# Patient Record
Sex: Female | Born: 1935 | Race: Black or African American | Hispanic: No | Marital: Married | Smoking: Never smoker
Health system: Southern US, Community
[De-identification: ages and names within clinical notes are randomized; demographics above are authoritative.]

## PROBLEM LIST (undated history)

## (undated) DIAGNOSIS — I442 Atrioventricular block, complete: Secondary | ICD-10-CM

## (undated) DIAGNOSIS — K649 Unspecified hemorrhoids: Secondary | ICD-10-CM

## (undated) DIAGNOSIS — E119 Type 2 diabetes mellitus without complications: Secondary | ICD-10-CM

## (undated) DIAGNOSIS — K5790 Diverticulosis of intestine, part unspecified, without perforation or abscess without bleeding: Secondary | ICD-10-CM

## (undated) DIAGNOSIS — M810 Age-related osteoporosis without current pathological fracture: Secondary | ICD-10-CM

## (undated) DIAGNOSIS — Z950 Presence of cardiac pacemaker: Secondary | ICD-10-CM

## (undated) DIAGNOSIS — I10 Essential (primary) hypertension: Secondary | ICD-10-CM

## (undated) DIAGNOSIS — E785 Hyperlipidemia, unspecified: Secondary | ICD-10-CM

## (undated) DIAGNOSIS — D126 Benign neoplasm of colon, unspecified: Secondary | ICD-10-CM

## (undated) HISTORY — DX: Essential (primary) hypertension: I10

## (undated) HISTORY — DX: Diverticulosis of intestine, part unspecified, without perforation or abscess without bleeding: K57.90

## (undated) HISTORY — PX: WISDOM TOOTH EXTRACTION: SHX21

## (undated) HISTORY — DX: Presence of cardiac pacemaker: Z95.0

## (undated) HISTORY — DX: Age-related osteoporosis without current pathological fracture: M81.0

## (undated) HISTORY — DX: Type 2 diabetes mellitus without complications: E11.9

## (undated) HISTORY — DX: Atrioventricular block, complete: I44.2

## (undated) HISTORY — DX: Benign neoplasm of colon, unspecified: D12.6

## (undated) HISTORY — PX: TOTAL ABDOMINAL HYSTERECTOMY W/ BILATERAL SALPINGOOPHORECTOMY: SHX83

## (undated) HISTORY — DX: Unspecified hemorrhoids: K64.9

## (undated) HISTORY — DX: Hyperlipidemia, unspecified: E78.5

---

## 1996-11-21 HISTORY — PX: BRAIN BIOPSY: SHX905

## 1999-09-09 ENCOUNTER — Encounter: Admission: RE | Admit: 1999-09-09 | Discharge: 1999-09-09 | Payer: Self-pay | Admitting: *Deleted

## 1999-09-09 ENCOUNTER — Encounter: Payer: Self-pay | Admitting: *Deleted

## 1999-12-13 ENCOUNTER — Encounter: Payer: Self-pay | Admitting: *Deleted

## 1999-12-13 ENCOUNTER — Encounter: Admission: RE | Admit: 1999-12-13 | Discharge: 1999-12-13 | Payer: Self-pay | Admitting: *Deleted

## 2000-12-20 ENCOUNTER — Encounter: Payer: Self-pay | Admitting: Internal Medicine

## 2000-12-20 ENCOUNTER — Encounter: Admission: RE | Admit: 2000-12-20 | Discharge: 2000-12-20 | Payer: Self-pay | Admitting: Internal Medicine

## 2002-05-08 ENCOUNTER — Encounter: Payer: Self-pay | Admitting: Internal Medicine

## 2002-05-08 ENCOUNTER — Encounter: Admission: RE | Admit: 2002-05-08 | Discharge: 2002-05-08 | Payer: Self-pay | Admitting: Internal Medicine

## 2002-05-13 ENCOUNTER — Encounter: Admission: RE | Admit: 2002-05-13 | Discharge: 2002-05-13 | Payer: Self-pay | Admitting: Internal Medicine

## 2002-05-13 ENCOUNTER — Encounter: Payer: Self-pay | Admitting: Internal Medicine

## 2003-07-23 ENCOUNTER — Encounter: Admission: RE | Admit: 2003-07-23 | Discharge: 2003-07-23 | Payer: Self-pay | Admitting: Internal Medicine

## 2003-07-23 ENCOUNTER — Encounter: Payer: Self-pay | Admitting: Internal Medicine

## 2004-08-23 ENCOUNTER — Encounter: Admission: RE | Admit: 2004-08-23 | Discharge: 2004-08-23 | Payer: Self-pay | Admitting: Internal Medicine

## 2004-12-22 ENCOUNTER — Ambulatory Visit: Payer: Self-pay | Admitting: Gastroenterology

## 2005-01-03 ENCOUNTER — Ambulatory Visit: Payer: Self-pay | Admitting: Gastroenterology

## 2005-08-26 ENCOUNTER — Encounter: Admission: RE | Admit: 2005-08-26 | Discharge: 2005-08-26 | Payer: Self-pay | Admitting: Internal Medicine

## 2005-08-29 ENCOUNTER — Ambulatory Visit: Payer: Self-pay | Admitting: Internal Medicine

## 2005-09-11 ENCOUNTER — Encounter: Payer: Self-pay | Admitting: Emergency Medicine

## 2005-09-11 ENCOUNTER — Ambulatory Visit: Payer: Self-pay | Admitting: Cardiology

## 2005-09-12 ENCOUNTER — Inpatient Hospital Stay (HOSPITAL_COMMUNITY): Admission: AD | Admit: 2005-09-12 | Discharge: 2005-09-14 | Payer: Self-pay | Admitting: Cardiology

## 2005-09-12 ENCOUNTER — Encounter: Payer: Self-pay | Admitting: Internal Medicine

## 2005-09-12 HISTORY — PX: CARDIAC CATHETERIZATION: SHX172

## 2005-09-13 HISTORY — PX: PACEMAKER INSERTION: SHX728

## 2005-09-28 ENCOUNTER — Ambulatory Visit: Payer: Self-pay

## 2006-01-10 ENCOUNTER — Ambulatory Visit: Payer: Self-pay | Admitting: Internal Medicine

## 2006-08-31 ENCOUNTER — Encounter: Admission: RE | Admit: 2006-08-31 | Discharge: 2006-08-31 | Payer: Self-pay | Admitting: Internal Medicine

## 2006-09-07 ENCOUNTER — Ambulatory Visit: Payer: Self-pay | Admitting: Internal Medicine

## 2006-10-20 ENCOUNTER — Ambulatory Visit: Payer: Self-pay | Admitting: Internal Medicine

## 2007-01-12 ENCOUNTER — Ambulatory Visit: Payer: Self-pay | Admitting: Internal Medicine

## 2007-04-06 ENCOUNTER — Ambulatory Visit: Payer: Self-pay | Admitting: Internal Medicine

## 2007-04-13 ENCOUNTER — Ambulatory Visit: Payer: Self-pay | Admitting: Internal Medicine

## 2007-06-29 ENCOUNTER — Ambulatory Visit: Payer: Self-pay | Admitting: Internal Medicine

## 2007-09-04 ENCOUNTER — Encounter: Admission: RE | Admit: 2007-09-04 | Discharge: 2007-09-04 | Payer: Self-pay | Admitting: Internal Medicine

## 2007-09-21 ENCOUNTER — Ambulatory Visit: Payer: Self-pay | Admitting: Internal Medicine

## 2008-03-14 ENCOUNTER — Ambulatory Visit: Payer: Self-pay | Admitting: Internal Medicine

## 2008-04-21 ENCOUNTER — Ambulatory Visit: Payer: Self-pay | Admitting: Internal Medicine

## 2008-06-13 ENCOUNTER — Ambulatory Visit: Payer: Self-pay | Admitting: Internal Medicine

## 2008-09-08 ENCOUNTER — Encounter: Admission: RE | Admit: 2008-09-08 | Discharge: 2008-09-08 | Payer: Self-pay | Admitting: Internal Medicine

## 2008-09-12 ENCOUNTER — Ambulatory Visit: Payer: Self-pay | Admitting: Internal Medicine

## 2008-10-22 ENCOUNTER — Ambulatory Visit: Payer: Self-pay

## 2008-12-12 ENCOUNTER — Ambulatory Visit: Payer: Self-pay | Admitting: Internal Medicine

## 2009-03-09 ENCOUNTER — Encounter: Payer: Self-pay | Admitting: Internal Medicine

## 2009-03-13 ENCOUNTER — Ambulatory Visit: Payer: Self-pay | Admitting: Internal Medicine

## 2009-05-05 DIAGNOSIS — E119 Type 2 diabetes mellitus without complications: Secondary | ICD-10-CM

## 2009-05-05 DIAGNOSIS — Z950 Presence of cardiac pacemaker: Secondary | ICD-10-CM

## 2009-05-05 DIAGNOSIS — E785 Hyperlipidemia, unspecified: Secondary | ICD-10-CM | POA: Insufficient documentation

## 2009-05-05 DIAGNOSIS — I10 Essential (primary) hypertension: Secondary | ICD-10-CM

## 2009-05-05 DIAGNOSIS — I442 Atrioventricular block, complete: Secondary | ICD-10-CM

## 2009-05-18 ENCOUNTER — Ambulatory Visit: Payer: Self-pay | Admitting: Internal Medicine

## 2009-06-27 ENCOUNTER — Emergency Department (HOSPITAL_COMMUNITY): Admission: EM | Admit: 2009-06-27 | Discharge: 2009-06-27 | Payer: Self-pay | Admitting: Emergency Medicine

## 2009-09-09 ENCOUNTER — Emergency Department (HOSPITAL_COMMUNITY): Admission: EM | Admit: 2009-09-09 | Discharge: 2009-09-09 | Payer: Self-pay | Admitting: Emergency Medicine

## 2009-09-15 ENCOUNTER — Encounter: Admission: RE | Admit: 2009-09-15 | Discharge: 2009-09-15 | Payer: Self-pay | Admitting: Internal Medicine

## 2009-09-18 ENCOUNTER — Ambulatory Visit: Payer: Self-pay | Admitting: Internal Medicine

## 2009-10-01 ENCOUNTER — Encounter: Admission: RE | Admit: 2009-10-01 | Discharge: 2009-10-01 | Payer: Self-pay | Admitting: Otolaryngology

## 2009-10-29 ENCOUNTER — Ambulatory Visit: Payer: Self-pay | Admitting: Internal Medicine

## 2009-12-02 ENCOUNTER — Encounter (INDEPENDENT_AMBULATORY_CARE_PROVIDER_SITE_OTHER): Payer: Self-pay | Admitting: *Deleted

## 2009-12-17 ENCOUNTER — Emergency Department (HOSPITAL_COMMUNITY): Admission: EM | Admit: 2009-12-17 | Discharge: 2009-12-17 | Payer: Self-pay | Admitting: Emergency Medicine

## 2009-12-18 ENCOUNTER — Ambulatory Visit: Payer: Self-pay | Admitting: Internal Medicine

## 2010-03-17 ENCOUNTER — Encounter (INDEPENDENT_AMBULATORY_CARE_PROVIDER_SITE_OTHER): Payer: Self-pay | Admitting: *Deleted

## 2010-03-18 ENCOUNTER — Ambulatory Visit: Payer: Self-pay | Admitting: Gastroenterology

## 2010-03-18 ENCOUNTER — Encounter (INDEPENDENT_AMBULATORY_CARE_PROVIDER_SITE_OTHER): Payer: Self-pay | Admitting: *Deleted

## 2010-03-19 ENCOUNTER — Ambulatory Visit: Payer: Self-pay | Admitting: Internal Medicine

## 2010-03-21 DIAGNOSIS — D126 Benign neoplasm of colon, unspecified: Secondary | ICD-10-CM

## 2010-03-21 HISTORY — DX: Benign neoplasm of colon, unspecified: D12.6

## 2010-03-30 ENCOUNTER — Ambulatory Visit: Payer: Self-pay | Admitting: Gastroenterology

## 2010-03-31 ENCOUNTER — Encounter: Payer: Self-pay | Admitting: Gastroenterology

## 2010-06-01 ENCOUNTER — Ambulatory Visit: Payer: Self-pay | Admitting: Internal Medicine

## 2010-09-03 ENCOUNTER — Ambulatory Visit: Payer: Self-pay | Admitting: Internal Medicine

## 2010-09-16 ENCOUNTER — Encounter: Admission: RE | Admit: 2010-09-16 | Discharge: 2010-09-16 | Payer: Self-pay | Admitting: Internal Medicine

## 2010-12-03 ENCOUNTER — Encounter: Payer: Self-pay | Admitting: Internal Medicine

## 2010-12-12 ENCOUNTER — Encounter: Payer: Self-pay | Admitting: Internal Medicine

## 2010-12-14 ENCOUNTER — Ambulatory Visit: Payer: Self-pay | Admitting: Internal Medicine

## 2010-12-23 NOTE — Letter (Signed)
Summary: Patient Notice- Polyp Results  [facility]  [address] [place], [place] [address]   Phone: [phone]  Fax: [phone]        Mar 31, 2010 MRN: [id number]    [facility] [address] [place], [place]  [address]    Dear Ms. CEDENO,  I am pleased to inform you that the colon polyp(s) removed during your recent colonoscopy was (were) found to be benign (no cancer detected) upon pathologic examination.  I recommend you have a repeat colonoscopy examination in 3 years to look for recurrent polyps, as having colon polyps increases your risk for having recurrent polyps or even colon cancer in the future.  Should you develop new or worsening symptoms of abdominal pain, bowel habit changes or bleeding from the rectum or bowels, please schedule an evaluation with either your primary care physician or with me.  Continue treatment plan as outlined the day of your exam.  Please call us if you are having persistent problems or have questions about your condition that have not been fully answered at this time.  Sincerely,  Meryl Dare MD [facility]  This letter has been electronically signed by your physician.  Appended Document: Patient Notice- Polyp Results letter mailed

## 2010-12-23 NOTE — Cardiovascular Report (Signed)
Summary: TTM   TTM   Imported By: Roderic Ovens 04/15/2010 11:56:37  _____________________________________________________________________  External Attachment:    Type:   Image     Comment:   External Document

## 2010-12-23 NOTE — Letter (Signed)
Summary: Diabetic Instructions  [facility]  [address]   [place], [place] [address]   Phone: [phone]  Fax: [phone]    [name] [birth year] MRN: [id number]   x     ORAL DIABETIC MEDICATION INSTRUCTIONS  The day before your procedure:   Take your diabetic pill as you do normally  The day of your procedure:   Do not take your diabetic pill    We will check your blood sugar levels during the admission process and again in Recovery before discharging you home  ________________________________________________________________________

## 2010-12-23 NOTE — Cardiovascular Report (Signed)
Summary: TTM   TTM   Imported By: Roderic Ovens 09/21/2010 15:20:19  _____________________________________________________________________  External Attachment:    Type:   Image     Comment:   External Document

## 2010-12-23 NOTE — Cardiovascular Report (Signed)
Summary: Office Visit   Card Device Clinic   Imported By: Roderic Ovens 11/26/2009 14:00:36  _____________________________________________________________________  External Attachment:    Type:   Image     Comment:   External Document

## 2010-12-23 NOTE — Assessment & Plan Note (Signed)
Summary: PACER/SAF   Visit Type:  follow-up Primary Provider:  tisovec   History of Present Illness: [name] returns today for PPM followup.  She is a 76 yo woman with a h/o CHB, who has DM, and HTN.  She denies c/p, sob, or recurrent syncope.  She had no specific complaints today.  No peripheral edema.  She has remained active until the weather became hot and she now admits to being fairly sedentary.  Current Medications (verified): 1)  Janumet 50-1000 Mg Tabs (Sitagliptin-Metformin Hcl) .Marland Kitchen.. 1 By Mouth Two Times A Day 2)  Glipizide Xl 10 Mg Xr24h-Tab (Glipizide) .... Take One Tablet By Mouth Twice Daily. 3)  Simvastatin 40 Mg Tabs (Simvastatin) .... Take One Tablet By Mouth Daily At Bedtime 4)  Metoprolol Tartrate 50 Mg Tabs (Metoprolol Tartrate) .... Take One Tablet By Mouth Twice A Day 5)  Lisinopril-Hydrochlorothiazide 20-25 Mg Tabs (Lisinopril-Hydrochlorothiazide) .Marland Kitchen.. 1 By Mouth Once Daily 6)  Vivelle-Dot 0.0375 Mg/24hr Pttw (Estradiol) .... As Directed 7)  Meclizine Hcl 25 Mg Tabs (Meclizine Hcl) .... As Needed 8)  Multivitamins   Tabs (Multiple Vitamin) .... Once Daily  Allergies (verified): No Known Drug Allergies  Past History:  Past Medical History: Last updated: 05/05/2009 DYSLIPIDEMIA (ICD-272.4) DM (ICD-250.00) PACEMAKER, PERMANENT (ICD-V45.01) HYPERTENSION (ICD-401.9) ATRIOVENTRICULAR BLOCK, COMPLETE (ICD-426.0)  Past Surgical History: Last updated: 05/05/2009 Cardiac Cath 09/12/05 Dr. Charlton Haws Implantation of a Medtronic Dual chamber pacemaker 09/13/05 Dr. Sharrell Ku  Review of Systems  The patient denies chest pain, syncope, dyspnea on exertion, and peripheral edema.    Vital Signs:  Patient profile:   76 year old female Height:      60 inches Weight:      118 pounds BMI:     23.13 Pulse rate:   77 / minute BP sitting:   128 / 62  (left arm)  Vitals Entered By: Laurance Flatten CMA (June 01, 2010 3:25 PM)  Physical Exam  General:  Well  developed, well nourished, in no acute distress. Head:  normocephalic and atraumatic Eyes:  PERRLA/EOM intact; conjunctiva and lids normal. Mouth:  Teeth, gums and palate normal. Oral mucosa normal. Neck:  Neck supple, no JVD. No masses, thyromegaly or abnormal cervical nodes. Chest Wall:  Well healed PPM incision. Lungs:  Clear bilaterally to auscultation.  No wheezes, raled, or rhonchi. Heart:  RRR with normal S1 and S2.  No murmurs Abdomen:  Bowel sounds positive; abdomen soft and non-tender without masses, organomegaly, or hernias noted. No hepatosplenomegaly. Pulses:  pulses normal in all 4 extremities Extremities:  Trace peripheral edema. Neurologic:  Alert and oriented x 3.   PPM Specifications Following MD:  Lewayne Bunting, MD     PPM Vendor:  Medtronic     PPM Model Number:  [id number]     PPM Serial Number:  [id number] PPM DOI:  09/15/2005     PPM Implanting MD:  Lewayne Bunting, MD  Lead 1    Location: RA     DOI: 09/13/2005     Model #: [id number]     Serial #: [id number]     Status: active Lead 2    Location: RV     DOI: 09/13/2005     Model #: [id number]     Serial #: [id number]     Status: active   Indications:  COMPLETE HEART BLOCK   PPM Follow Up Battery Voltage:  2.79 V     Battery Est. Longevity:  56yrs     Pacer Dependent:  Yes  PPM Device Measurements Atrium  Amplitude: 2.80 mV, Impedance: 406 ohms, Threshold: 1.00 V at 0.40 msec Right Ventricle  Amplitude: 11.20 mV, Impedance: 591 ohms, Threshold: 1.00 V at 0.40 msec  Episodes MS Episodes:  0     Ventricular High Rate:  0     Atrial Pacing:  5.2%     Ventricular Pacing:  99.9%  Parameters Mode:  DDDR     Lower Rate Limit:  60     Upper Rate Limit:  120 Paced AV Delay:  150     Sensed AV Delay:  120 Next Cardiology Appt Due:  11/22/2010 Tech Comments:  NORMAL DEVICE FUNCTION.  NO EPISODES SINCE LAST CHECK.  ROV IN 6 M[facility]S CLINIC.  MEDNET CHECK 3 MTHS. Vella Kohler  June 01, 2010 3:36 PM MD Comments:  Agree with  above.  Impression & Recommendations:  Problem # 1:  PACEMAKER, PERMANENT (ICD-V45.01) Her device is working normally.  Will recheck in several months.  Problem # 2:  HYPERTENSION (ICD-401.9) Her blood pressure is well controlled today.  She will continue meds as below and maintain a low sodium diet. Her updated medication list for this problem includes:    Metoprolol Tartrate 50 Mg Tabs (Metoprolol tartrate) .Marland Kitchen... Take one tablet by mouth twice a day    Lisinopril-hydrochlorothiazide 20-25 Mg Tabs (Lisinopril-hydrochlorothiazide) .Marland Kitchen... 1 by mouth once daily  Patient Instructions: 1)  Your physician recommends that you schedule a follow-up appointment in: 12 months with Dr Ladona Ridgel

## 2010-12-23 NOTE — Miscellaneous (Signed)
Summary: [facility] previsit  Clinical Lists Changes  Medications: Added new medication of MOVIPREP 100 GM  SOLR (PEG-KCL-NACL-NASULF-NA ASC-C) As per prep instructions. - Signed Rx of MOVIPREP 100 GM  SOLR (PEG-KCL-NACL-NASULF-NA ASC-C) As per prep instructions.;  #1 x 0;  Signed;  Entered by: Karl Bales RN;  Authorized by: Meryl Dare MD [facility];  Method used: [facility] [id number]*, [address], [place], [place]  [address], Ph: [phone] or [phone], Fax: [phone] Observations: Added new observation of NKA: F (03/18/2010 11:15)    Prescriptions: MOVIPREP 100 GM  SOLR (PEG-KCL-NACL-NASULF-NA ASC-C) As per prep instructions.  #1 x 0   Entered by:   Karl Bales RN   Authorized by:   Meryl Dare MD [facility]   Signed by:   Karl Bales RN on 03/18/2010   Method used:   [facility]ronically to        [facility] [phone]* (retail)       [address]       [place], [place]  [address]       Ph: [phone] or [phone]       Fax: [phone]   RxID:   [id number]

## 2010-12-23 NOTE — Procedures (Signed)
Summary: Colonoscopy  Patient: [name] Note: All result statuses are Final unless otherwise noted.  Tests: (1) Colonoscopy (COL)   COL Colonoscopy           DONE (C)     [facility]     [address]     [place], [place]  [address]           COLONOSCOPY PROCEDURE REPORT           PATIENT:  Barbara, Valentine  MR#:  [id number]     BIRTHDATE:  [birth year], 74 yrs. old  GENDER:  female           ENDOSCOPIST:  Judie Petit T. Russella Dar, MD, [facility]           PROCEDURE DATE:  03/30/2010     PROCEDURE:  Colonoscopy with snare polypectomy, Colonoscopy with     hot biopsy     ASA CLASS:  Class II     INDICATIONS:  1) follow-up of polyp,  surveillance and high-risk     screening, adenomatous polyp, 12/2004.     MEDICATIONS:   Fentanyl 50 mcg IV, Versed 5 mg IV     DESCRIPTION OF PROCEDURE:   After the risks benefits and     alternatives of the procedure were thoroughly explained, informed     consent was obtained.  Digital rectal exam was performed and     revealed no abnormalities.   The LB [id number]-H180AL [id number] endoscope     was introduced through the anus and advanced to the cecum, which     was identified by both the appendix and ileocecal valve, without     limitations.  The quality of the prep was excellent, using     MoviPrep.  The instrument was then slowly withdrawn as the colon     was fully examined.     <<PROCEDUREIMAGES>>           FINDINGS:  A sessile polyp was found in the ascending colon. It     was 6 mm in size. Polyp was snared without cautery. Retrieval was     successful.  Two polyps were found in the mid transverse colon.     They were 5 mm in size. Polyps were snared without cautery.     Retrieval was successful. Two polyps were found in the descending     colon. They were 4 mm in size. With hot biopsy forceps the polyps     were cauterized, biopsies were obtained and sent to pathology.  A     sessile polyp was found in the sigmoid colon. It was 4 mm in size.     With hot biopsy forceps the polyp was cauterized, biopsy was     obtained and sent to pathology.  Mild diverticulosis was found in     the ascending colon. This was otherwise a normal examination of     the colon. Retroflexed views in the rectum revealed internal     hemorrhoids, small. The time to cecum =  2  minutes. The scope was     then withdrawn (time =  9  min) from the patient and the procedure     completed.           COMPLICATIONS:  None           ENDOSCOPIC IMPRESSION:     1) 6 mm sessile polyp in the ascending colon     2) 5 mm Two polyps in  the mid transverse colon     3) 4 mm Two polyps in the descending colon     4) 4 mm sessile polyp in the sigmoid colon     5) Mild diverticulosis in the ascending colon     6) Internal hemorrhoids           RECOMMENDATIONS:     1) high fiber diet     2) If the 3 or more polyps removed today are proven to be     adenomatous (pre-cancerous) polyps, you will need a colonoscopy in     3 years. Otherwise you should have a colonoscopy in 5 years.     3) No ASA/NSAIDs for 2 weeks     4) Await pathology           Grafton Warzecha T. Russella Dar, MD, [facility]           CC: Guerry Bruin, MD           n.     REVISED:  03/30/2010 10:30 AM     eSIGNED:   Judie Petit T. Sherine Cortese at 03/30/2010 10:30 AM           Benedetto Coons, [id number]  Note: An exclamation mark (!) indicates a result that was not dispersed into the flowsheet. Document Creation Date: 03/30/2010 10:31 AM _______________________________________________________________________  (1) Order result status: Final Collection or observation date-time: 03/30/2010 09:08 Requested date-time:  Receipt date-time:  Reported date-time:  Referring Physician:   Ordering Physician: Claudette Head [phone]) Specimen Source:  Source: Launa Grill Order Number: [id number] Lab site:   Appended Document: Colonoscopy     Procedures Next Due Date:    Colonoscopy: 03/2013

## 2010-12-23 NOTE — Cardiovascular Report (Signed)
Summary: Office Visit   Office Visit   Imported By: Roderic Ovens 06/04/2010 11:55:46  _____________________________________________________________________  External Attachment:    Type:   Image     Comment:   External Document

## 2010-12-23 NOTE — Letter (Signed)
Summary: Colonoscopy Letter  [facility]  [address] [place], [place] [address]   Phone: [phone]  Fax: [phone]      December 02, 2009 MRN: [id number]   [facility] [address] [place], [place]  [address]   Dear [name],   According to your medical record, it is time for you to schedule a Colonoscopy. The American Cancer Society recommends this procedure as a method to detect early colon cancer. Patients with a family history of colon cancer, or a personal history of colon polyps or inflammatory bowel disease are at increased risk.  This letter has beeen generated based on the recommendations made at the time of your procedure. If you feel that in your particular situation this may no longer apply, please contact our office.  Please call our office at [phone] to schedule this appointment or to update your records at your earliest convenience.  Thank you for cooperating with [place] to provide you with the very best care possible.   Sincerely,  Judie Petit T. Russella Dar, M.D.  [facility] Division [phone]

## 2010-12-23 NOTE — Letter (Signed)
Summary: [facility] Instructions  [facility]  [address] [place], [place] [address]   Phone: [phone]  Fax: [phone]       [name]    30-Aug-1935    MRN: [id number]        Procedure Day Dorna Bloom:  Jake Shark  03/30/10     Arrival Time:  7:30AM     Procedure Time:  8:30AM     Location of Procedure:                    _X _  [facility] (4th Floor)                        PREPARATION FOR COLONOSCOPY WITH MOVIPREP   Starting 5 days prior to your procedure 03/25/10 do not eat nuts, seeds, popcorn, corn, beans, peas,  salads, or any raw vegetables.  Do not take any fiber supplements (e.g. Metamucil, Citrucel, and Benefiber).  THE DAY BEFORE YOUR PROCEDURE         DATE: 03/29/10  DAY: MONDAY  1.  Drink clear liquids the entire day-NO SOLID FOOD  2.  Do not drink anything colored red or purple.  Avoid juices with pulp.  No orange juice.  3.  Drink at least 64 oz. (8 glasses) of fluid/clear liquids during the day to prevent dehydration and help the prep work efficiently.  CLEAR LIQUIDS INCLUDE: Water Jello Ice Popsicles Tea (sugar ok, no milk/cream) Powdered fruit flavored drinks Coffee (sugar ok, no milk/cream) Gatorade Juice: apple, white grape, white cranberry  Lemonade Clear bullion, consomm, broth Carbonated beverages (any kind) Strained chicken noodle soup Hard Candy                             4.  In the morning, mix first dose of MoviPrep solution:    Empty 1 Pouch A and 1 Pouch B into the disposable container    Add lukewarm drinking water to the top line of the container. Mix to dissolve    Refrigerate (mixed solution should be used within 24 hrs)  5.  Begin drinking the prep at 5:00 p.m. The MoviPrep container is divided by 4 marks.   Every 15 minutes drink the solution down to the next mark (approximately 8 oz) until the full liter is complete.   6.  Follow completed prep with 16 oz of clear liquid of your choice  (Nothing red or purple).  Continue to drink clear liquids until bedtime.  7.  Before going to bed, mix second dose of MoviPrep solution:    Empty 1 Pouch A and 1 Pouch B into the disposable container    Add lukewarm drinking water to the top line of the container. Mix to dissolve    Refrigerate  THE DAY OF YOUR PROCEDURE      DATE: 03/30/10  DAY: TUESDAY  Beginning at 3:30AM (5 hours before procedure):         1. Every 15 minutes, drink the solution down to the next mark (approx 8 oz) until the full liter is complete.  2. Follow completed prep with 16 oz. of clear liquid of your choice.    3. You may drink clear liquids until 6:30AM (2 HOURS BEFORE PROCEDURE).   MEDICATION INSTRUCTIONS  Unless otherwise instructed, you should take regular prescription medications with a small sip of water   as early as possible the morning  of your procedure.  Diabetic patients - see separate instructions.  Please hold your Lisinopril- HCTZ the day of your procedure only (before coming into procedure)         OTHER INSTRUCTIONS  You will need a responsible adult at least 75 years of age to accompany you and drive you home.   This person must remain in the waiting room during your procedure.  Wear loose fitting clothing that is easily removed.  Leave jewelry and other valuables at home.  However, you may wish to bring a book to read or  an iPod/MP3 player to listen to music as you wait for your procedure to start.  Remove all body piercing jewelry and leave at home.  Total time from sign-in until discharge is approximately 2-3 hours.  You should go home directly after your procedure and rest.  You can resume normal activities the  day after your procedure.  The day of your procedure you should not:   Drive   Make legal decisions   Operate machinery   Drink alcohol   Return to work  You will receive specific instructions about eating, activities and medications before you  leave.    The above instructions have been reviewed and explained to me by  Karl Bales RN  March 18, 2010 11:46 AM      I fully understand and can verbalize these instructions _____________________________ Date _________

## 2010-12-23 NOTE — Cardiovascular Report (Signed)
Summary: TTM   TTM   Imported By: Roderic Ovens 01/18/2010 13:59:40  _____________________________________________________________________  External Attachment:    Type:   Image     Comment:   External Document

## 2010-12-29 NOTE — Cardiovascular Report (Signed)
Summary: TTM   TTM   Imported By: Roderic Ovens 12/21/2010 12:02:00  _____________________________________________________________________  External Attachment:    Type:   Image     Comment:   External Document

## 2011-01-27 ENCOUNTER — Encounter (INDEPENDENT_AMBULATORY_CARE_PROVIDER_SITE_OTHER): Payer: Self-pay | Admitting: *Deleted

## 2011-02-01 NOTE — Letter (Signed)
Summary: Device-Delinquent Check  [facility], Main Office  [address]   [place], [place] [address]   Phone: [phone]  Fax: [phone]     January 27, 2011 MRN: [id number]   [facility] [address] [place], [place]  [address]   Dear Ms. WESTERFIELD,  According to our records, you have not had your implanted device checked in the recommended period of time.  We are unable to determine appropriate device function without checking your device on a regular basis.  Please call our office to schedule an appointment as soon as possible.  If you are having your device checked by another physician, please call us so that we may update our records.  Thank you,  Letta Moynahan, EMT  January 27, 2011 2:17 PM  [facility]

## 2011-02-07 LAB — GLUCOSE, CAPILLARY: Glucose-Capillary: 99 mg/dL (ref 70–99)

## 2011-02-08 LAB — GLUCOSE, CAPILLARY
Glucose-Capillary: 182 mg/dL — ABNORMAL HIGH (ref 70–99)
Glucose-Capillary: 196 mg/dL — ABNORMAL HIGH (ref 70–99)

## 2011-02-24 LAB — GLUCOSE, CAPILLARY: Glucose-Capillary: 229 mg/dL — ABNORMAL HIGH (ref 70–99)

## 2011-03-04 ENCOUNTER — Encounter: Payer: Self-pay | Admitting: Internal Medicine

## 2011-03-04 DIAGNOSIS — I442 Atrioventricular block, complete: Secondary | ICD-10-CM

## 2011-04-05 NOTE — Assessment & Plan Note (Signed)
[facility]                         ELECTROPHYSIOLOGY OFFICE NOTE   NAME:[name], [name]                   MRN:          [id number]  DATE:04/13/2007                            DOB:          1935-11-07    [name] returns today for followup.  She is a very pleasant woman  with the history of hypertension, diabetes and complete heart block, who  is status post pacemaker insertion back in 2006.  She returns today for  followup.   Her only complaint today is that she notes when she goes to certain  stores, she sets off the store alarm.  She denies any other symptoms and  has had no other specific complaints.   PHYSICAL EXAM:  She is a pleasant, well-appearing woman, in no distress.  The blood pressure 122/72, the pulse was 70 and regular, the  respirations were 18, the weight was 120 pounds.  NECK:  Revealed no jugular venous distention.  LUNGS:  Clear bilaterally to auscultation, no wheezes, rales or rhonchi.  CARDIOVASCULAR EXAM:  Revealed a regular rate and rhythm with normal S1  and S2.  EXTREMITIES:  Demonstrated no cyanosis, clubbing or edema.  The pulses  were 2+ and symmetric.   MEDICATIONS INCLUDE:  1. Metformin 1000 mg twice daily.  2. Lisinopril/HCTZ 20/25.  3. Aspirin 81 daily.  4. Simvastatin 40 daily.  5. Glyburide 5 twice a day.  6. Metoprolol 50 twice daily.   On interrogation of her pacemaker, she has a Medtronic Sigma with P-  waves greater than 2.  There were no R-waves, secondary to underlying  complete heart block with no escape.  The impedance is 424 ohms in the  atrium, 609 in the ventricle.  The threshold of 0.24 in both the right  atrium and right ventricle.  The battery voltage is 2.79 volts.   IMPRESSION:  1. Complete heart block.  2. Hypertension.  3. Status post pacemaker insertion.   DISCUSSION:  Overall, [name] is stable.  Her pacemaker is working  normally.  We will plan to see her back in the  office in one year for  pacemaker followup.     Doylene Canning. Ladona Ridgel, MD  Electronically Signed    GWT/MedQ  DD: 04/13/2007  DT: 04/13/2007  Job #: [id number]   cc:   Gaspar Garbe, M.D.

## 2011-04-05 NOTE — Assessment & Plan Note (Signed)
[facility]                         ELECTROPHYSIOLOGY OFFICE NOTE   NAME:CAMPBELLBlimy, Napoleon                   MRN:          [id number]  DATE:04/21/2008                            DOB:          [birth year]    Ms.  Lacour returns today for followup.  She is a very pleasant 76-  year-old woman with a history of diabetes, hypertension, and  dyslipidemia who has complete heart block and underwent permanent  pacemaker insertion back in October of 2006.  She returns today for  followup.  She denies chest pain.  She denies shortness of breath.  She  notes that her blood sugar has been fairly well-controlled, but when her  sugars drop below 100, she feels bad.  She has had no peripheral edema.   MEDICATIONS:  1. Janumet 50/1000 twice daily.  2. Calcium.  3. Multivitamin.  4. Metoprolol 50 a day.  5. Glyburide 5 twice a day.  6. Simvastatin 40 daily.  7. Aspirin 81 a day.  8. Lisinopril/hydrochlorothiazide 20/25 a day.   PHYSICAL EXAMINATION:  GENERAL:  She is a pleasant, very well-appearing  76 year old woman in no distress.  VITAL SIGNS:  Blood pressure was 118/78, pulse was 82 and regular,  respirations were 18.  Weight was 119 pounds.  NECK:  Revealed no jugular distention.  LUNGS:  Clear bilaterally to auscultation.  No wheezes, rales or rhonchi  were present.  CARDIAC:  Regular rate and rhythm.  Normal S1-S2.  No murmurs, rubs,  gallops.  EXTREMITIES:  No edema.   Interrogation of her pacemaker demonstrates a Medtronic Sigma.  The P  waves were greater than 2.  There were no R waves.  The impedance was  412, 620 in the V and 412 in the A.  The threshold was 1 volt at 0.4 in  the A and in the V.  The battery voltage was 2.79 volts.  She was 90% A  sense V paced 9% A paced V paced.   IMPRESSION:  1. Complete heart block.  2. Hypertension.  3. Diabetes.  4. Dyslipidemia.   DISCUSSION:  Overall, Ms. Zettler is stable.  Her pacemaker is  working  normally.  Her other problems are also stable.  We will see her back in  1 year in followup.     Doylene Canning. Ladona Ridgel, MD  Electronically Signed    GWT/MedQ  DD: 04/21/2008  DT: 04/21/2008  Job #: [id number]

## 2011-04-08 NOTE — Discharge Summary (Signed)
[name], [name]           ACCOUNT NO.:  [id number]   MEDICAL RECORD NO.:  [id number]          PATIENT TYPE:  INP   LOCATION:  4713                         FACILITY:  [facility]   PHYSICIAN:  Lorain Childes, M.D. LHCDATE OF BIRTH:  06/14/1935   DATE OF ADMISSION:  09/12/2005  DATE OF DISCHARGE:                                 DISCHARGE SUMMARY   ADDENDUM:  This concerns a corrected medication list.   1.  Lisinopril/hydrochlorothiazide 20/25 one tablet daily.  2.  Lipitor 20 mg tablets one half tablet daily at bedtime.  3.  Glyburide 5 mg tablets two in the morning and one in the evening.  4.  Enteric coated aspirin 81 mg daily.  5.  Meclizine 25 mg p.r.n.  6.  Norvasc 5 mg daily.  7.  Restart Metformin 1000 mg twice daily on Thursday, September 15, 2005.   This is a revised update for the medications for [name].  Please disregard the previous medication list on the discharge summary.      Maple Mirza, P.A.    ______________________________  Lorain Childes, M.D. [facility]    GM/MEDQ  D:  09/14/2005  T:  09/14/2005  Job:  [id number]   cc:   Gaspar Garbe, M.D.  Fax: [phone]   Doylene Canning. Ladona Ridgel, M.D.  [address]  [address]  [place]  [place] [address]

## 2011-04-08 NOTE — Cardiovascular Report (Signed)
[name], [name]           ACCOUNT NO.:  [id number]   MEDICAL RECORD NO.:  [id number]          PATIENT TYPE:  INP   LOCATION:  2913                         FACILITY:  [facility]   PHYSICIAN:  Charlton Haws, M.D.     DATE OF BIRTH:  [birth year]   DATE OF PROCEDURE:  09/12/2005  DATE OF DISCHARGE:                              CARDIAC CATHETERIZATION   Coronary arteriography.   INDICATIONS:  Complete heart block, questionable MI on ECG.   Cine catheterization was done from right femoral artery. The patient was in  complete heart block with a narrow complex QRS at a rate of 40 during the  case.   PLAN:  Because of this we did not treat her systolic hypertension  aggressively.   Left main coronary artery is normal.   Left anterior descending artery had 20-30% multi-discrete lesions  proximally. Mid and distal vessel were normal. First and second diagonal  branch were normal.   Circumflex coronary artery was nondominant and normal.   Right coronary artery is dominant and normal.   RAO VENTRICULOGRAPHY:  RAO ventriculography was normal. LV was hyperdynamic.  EF was 65%. Aortic pressures 170/58, LV pressure is 175/21.   IMPRESSION:  The patient's complete heart block would appear to be primarily  an electrical problem. She has no evidence coronary artery disease. She is  currently stable. There was no indication for temporary pacing. She will  have a permanent pacemaker placed by Dr. Lewayne Bunting in the morning.           ______________________________  Charlton Haws, M.D.     PN/MEDQ  D:  09/12/2005  T:  09/12/2005  Job:  [id number]

## 2011-04-08 NOTE — H&P (Signed)
[name], [name]           ACCOUNT NO.:  [id number]   MEDICAL RECORD NO.:  [id number]          PATIENT TYPE:  INP   LOCATION:  2913                         FACILITY:  [facility]   PHYSICIAN:  Lorain Childes, M.D. LHCDATE OF BIRTH:  1935-05-11   DATE OF ADMISSION:  09/12/2005  DATE OF DISCHARGE:                                HISTORY & PHYSICAL   PRIMARY CARE PHYSICIAN:  Gaspar Garbe, M.D.; she is new to cardiology.   CHIEF COMPLAINT:  Hypertensive urgency and complete heart block.   HISTORY OF PRESENT ILLNESS:  The patient is a 76 year old female with a  history of diabetes, hypertension, and hyperlipidemia, who presented to the  emergency room with complaints of headache and tiredness throughout the day.  The patient reports that she was seen by her primary care physician on  September 01, 2005, as she was feeling lightheaded and dizzy.  An EKG and  chest x-ray were done and reported to be okay.  Her blood pressure was noted  to be low at that visit, so her lisinopril/HCT was decreased.  She was told  to take a half a pill.  She followed her blood pressure closely following  this, and it started to elevate.  She began taking an entire pill 3-4 days  ago.  Today, she noted that she continued to feel fatigued and have some  dizziness.  She also reported a headache, which began around 3 p.m.  She  called the on call primary care physician, and was told to take an  additional dose of her high blood pressure medication, as her blood pressure  was markedly elevated, greater than 200.  Her blood pressure improved some;  however, she still felt dizzy, so she came to [facility] emergency room for  further evaluation.  She also reports that she had nausea with emesis x1 en  route.  In the emergency room, her blood pressure was noted to be 212/110.  Her heart rate was in the 30s to 40s, noted to be in complete heart block.  Cardiology was called for further evaluation.   On  further review, the patient reports she has had decreased exercise for  the past week.  She has been feeling fatigued and dizzy.  At her primary  care physician visit last week, she was given Meclizine, which has not  improved her dizziness.  She denies any chest pain, chest pressure.  No  shortness of breath, no palpitations, no syncope, no PND or orthopnea, no  lower extremity edema.   PAST MEDICAL HISTORY:  1.  Diabetes.  2.  Hypertension.  3.  Hyperlipidemia.   ALLERGIES:  SULFA.   MEDICATIONS:  1.  Metformin extended release 1 gm p.o. b.i.d.  2.  Lisinopril/HCT 20/25 mg one tablet p.o. daily (this was decreased to one-      half tablet daily by her primary care physician a week ago).  3.  Glyburide 5 mg p.o. t.i.d.  4.  Lipitor 20 mg p.o. daily.  5.  Meclizine 25 mg p.o. p.r.n. dizziness.  6.  Aspirin 81 mg p.o. daily.   SOCIAL  HISTORY:  She lives in [place] with her husband.  She is retired.  She denies any tobacco.  No alcohol, no drugs.  No herbal medications.  She  exercises on a treadmill for 20 minutes 3 times a week; however, she has not  done this for the past month.   FAMILY HISTORY:  Her mother died at the age of 28 with Alzheimer's.  Father  died in his 46s related to lung cancer.  She has 4 sisters and 3 brothers.  There is no coronary disease, no pacemakers, no sudden cardiac death.   REVIEW OF SYSTEMS:  She denies any fevers or chills.  No weight changes.  She reports headache that she had earlier today, which is improved.  She  denies any visual changes.  No hearing difficulty.  No skin rashes or  lesions.  No chest pain, shortness of breath, dyspnea on exertion,  orthopnea, PND.  No edema.  No coughing or wheezing.  No urinary symptoms.  No neuropsychiatric.  GI - she reports nausea and vomiting x1 today.  No  diarrhea, no bright red blood per rectum, no melena.  She has occasional  GERD symptoms, but none recently.  No pyuria, no polydipsia, no heat or  cold  intolerance.  All other review of systems are negative.   PHYSICAL EXAMINATION:  VITAL SIGNS:  Temperature 98.8, pulse 42,  respirations 14, blood pressure 168/78.  She is satting 100% on room air.  GENERAL:  She is a younger than stated age female, comfortable, in no acute  distress.  HEENT:  Normocephalic and atraumatic.  Oropharynx is clear.  NECK:  No bruits.  She canon A waves present.  CARDIOVASCULAR:  Normal S1, slow S2 .  Bradycardic, irregular.  No murmurs  appreciated.  Pulses are 2+ throughout.  There are no bruits.  LUNGS:  Clear to auscultation bilaterally.  ABDOMEN:  Soft, nontender.  Positive bowel sounds.  No organomegaly.  EXTREMITIES:  No edema.  NEUROLOGIC:  She is alert and oriented x3.  Cranial nerves are grossly  intact.  Strength is 5/5 in upper and lower extremities bilaterally.   EKG:  EKG shows a rate of 40, sinus rhythm, complete heart block, with  junctional escape at 40.  QRS duration is 88.  QTC is 446.  She has Q waves  noted in II, III, and aVF.  She has no ST changes, no hypertrophy.  No other  EKG is available.   LABORATORY DATA:  White count 10.8, hematocrit 36.6, platelets 277.  Potassium 3.7, creatinine 1.1, glucose 138.  CK-MB less than 1.0.  Troponin  less than 0.05.   ASSESSMENT AND PLAN:  A 76 year old female with hypertensive urgency and  complete heart block.  1.  Complete heart block.  The patient has a junctional escape in the 30s to      50s; etiology currently is unclear.  She has been symptomatic with      dizziness and fatigue.  We will assess her for ischemia by following her      cardiac enzymes.  She does have Q waves noted on her EKG.  I am      concerned about her excessive fatigue earlier in the week, maybe being      an ischemic event.  We will continue to cycle enzymes, monitor her on      telemetry.  Will decide between cardiac catheterization versus stress      test, depending on the results of her enzymes.  Will  order an     echocardiogram to assess her LV function and overall valvular function.      Will also check a thyroid panel, as this can cause complete heart block.      She is not on any medications, which would contribute to this currently.      We will maintain her on telemetry.  She has __________ on, which were      placed in the emergency room.  Will continue that for now.  Will have      electrophysiology evaluate her for probable pacemaker.  2.  Hypertensive urgency.  Her blood pressure was markedly elevated in the      emergency room and has improved currently.  We will adjust her      medications as needed.  I will continue her lisinopril and      hydrochlorothiazide, and I have also started amlodipine.  Her neurologic      exam is stable without any acute deficits.  A head CT is pending.  3.  Cardiovascular.  She does have Q waves on her EKG.  Cardiac enzymes are      being assessed, and she will undergo an ischemic evaluation during this      hospitalization.  4.  Diabetes.  I have held her metformin, as she may get contrast dye in the      setting of pacemaker implantation and possible cardiac catheterization.      I will continue her on glyburide and cover her with sliding scale.  5.  Hyperlipidemia.  Will check a fasting lipid panel.  Will continue her      current Lipitor.           ______________________________  Lorain Childes, M.D. [facility]     CGF/MEDQ  D:  09/12/2005  T:  09/12/2005  Job:  [id number]

## 2011-04-08 NOTE — Discharge Summary (Signed)
NAMEBRITANEY, ESPAILLAT           ACCOUNT NO.:  [id number]   MEDICAL RECORD NO.:  [id number]          PATIENT TYPE:  INP   LOCATION:  4713                         FACILITY:  [facility]   PHYSICIAN:  Lorain Childes, M.D. LHCDATE OF BIRTH:  [birth year]   DATE OF ADMISSION:  09/12/2005  DATE OF DISCHARGE:  09/14/2005                                 DISCHARGE SUMMARY   ALLERGIES:  SULFA which gives blisters and a rash.   DISCHARGE DOCTOR VISIT:  30 minutes.   DISCHARGE DIAGNOSES:  1.  Day #1 status post implantation of Medtronic SIGMA dual-chamber      pacemaker.  2.  Day #2 status post left heart catheterization.  This study demonstrated      ejection fraction of 65%, normal coronary anatomy except for 20-30%      lesions in the proximal left anterior descending.  3.  Admitted with fatigue/presyncope/headache, found to be in complete heart      block.   SECONDARY DIAGNOSES:  1.  Hypertension.  2.  Dyslipidemia.  3.  Diabetes.   PROCEDURE:  1.  September 12, 2005, Charlton Haws, M.D., left heart catheterization with      questionable myocardial infarction on electrocardiogram.  Left main is      normal.  Left anterior descending had 20-30% multi-discrete proximal      lesions.  Mid and distal vessel normal.  First and second diagonal      branches normal.  Left circumflex coronary artery normal and non-      dominant.  Right coronary artery dominant and normal.  Ejection fraction      65%.  2.  September 13, 2005, implantation of Medtronic SIGMA dual-chamber pacemaker      by Doylene Canning. Ladona Ridgel, M.D. for complete heart block.  The patient is      currently A sense-V pacing and is pacer dependent at the time of      discharge.   DISCHARGE DISPOSITION:  Ms. Lekas has had no post-procedural  complications from either left heart catheterization or pacemaker  implantation.  The incision at the pacemaker pocket is without hematoma,  healing nicely, nontender.  She is alert, oriented x3.   She has been  afebrile this hospitalization.  She is achieving 96% oxygen saturations on  room air.  The chest x-ray shows that the leads for the pacemaker both right  ventricular and right atrial are in appropriate position.  The device has  been interrogated the morning of post-procedure day #1 with values within  normal limits.  A Lyme titer was obtained.  This was negative this  hospitalization.  Complete blood count this hospitalization:  White cells  11.9, hemoglobin 12.8, hematocrit 38.9, platelets 274,000.  Serum  electrolytes:  Sodium 137, potassium 3.8, chloride 105, bicarbonate 26, BUN  14, creatinine 1.2, and glucose 145.   The patient discharged with instructions to maintain a low-sodium, low-  cholesterol diet.  She is asked not to drive for the next seven days, not to  lift anything more than 10 pounds for the next four weeks.  Mobility of the  left arm has  been discussed with the patient.  She is keep her incision dry  for the next seven days, to sponge bathe until Tuesday, September 20, 2005.   MEDICATIONS AT DISCHARGE:  1.  Tylenol 325 mg 1-2 tablets every 4-6 h. as needed for pain.  2.  Lisinopril/hydrochlorothiazide 20/25 1/2 tablet daily.  3.  Lipitor 20 mg daily at bedtime.  4.  Glyburide 5 mg three times daily.  5.  Enteric-coated aspirin 81 mg daily.  6.  Meclizine 25 mg daily.  7.  A new medication, Norvasc 5 mg daily.  8.  She is restart Metformin 1000 mg twice daily on Thursday, September 15, 2005.   FOLLOW UP:  [facility] at the [facility] on Wednesday, September 28, 2005, at 9:30, and she sees Dr. Ladona Ridgel on  January 10, 2006 at 11:20 in the morning.   BRIEF HISTORY:  Ms.  Dorrough is a 76 year old female.  She is presenting to  the emergency room with the complaint of headache, fatigue, and presyncope.  She presented to her primary care giver on September 01, 2005.  She felt  lightheaded and dizzy.  An EKG was  obtained and was reportedly without  abnormality.  Blood pressure was noted to be low, so her  lisinopril/hydrochlorothiazide was decreased to 1/2 a tablet daily.  Blood  pressure was followed closely after this and started to elevate.  She  started taking an entire pill 3-4 days ago.  Today, she presents, September 12, 2005, with continue fatigue and some dizziness and headache.  She  presented to the [facility] for further evaluation.  She had  nausea with emesis x1 on route.  Blood pressure was noted to be 212/110 in  the emergency room.  Heart rate was in the 30s-40s, and noted by  electrocardiogram to be in complete heart block.  Cardiology is evaluating  further.  The patient does seem to have Q waves on her electrocardiogram.  Cardiac enzymes are being assessed.  Ischemic evaluation and possible  pacemaker are planned this hospitalization.  Troponins studies were 0.09,  then 0.05.   HOSPITAL COURSE:  The patient was presented September 12, 2005 with presyncope  and fatigue, found to be in complete heart block with abnormal  electrocardiogram.  She underwent left heart catheterization on September 12, 2005.  This study showed normal coronary anatomy with ejection fraction at  65%.  Her troponin-I studies had been normal.  She then was seen by Doylene Canning.  Ladona Ridgel, M.D. in consultation for electrophysiology.  The patient does  qualify for a permanent pacemaker based on bradycardia in the setting of  complete heart block.  She underwent this procedure on September 13, 2005, and  has had no post-procedural complications.  She is A sense-V paced at the  time  of discharge.  Further laboratory studies include a fasting lipid profile.  The cholesterol is 148.  Her triglycerides are 49.  HDL cholesterol 68.  LDL  cholesterol is 70.  This is an excellent profile for a diabetic person.  TSH is 2.270.  The patient is discharged with the medication profile and  followup as  dictated.      Maple Mirza, P.A.    ______________________________  Lorain Childes, M.D. [facility]    GM/MEDQ  D:  09/14/2005  T:  09/14/2005  Job:  [id number]   cc:   Doylene Canning. Ladona Ridgel, M.D.  [address]  [address]  [place]  [place] [address]   Gaspar Garbe, M.D.  Fax: [phone]

## 2011-04-08 NOTE — Op Note (Signed)
NAMEANN, BOHNE           ACCOUNT NO.:  [id number]   MEDICAL RECORD NO.:  [id number]          PATIENT TYPE:  INP   LOCATION:  2913                         FACILITY:  [facility]   PHYSICIAN:  Doylene Canning. Ladona Ridgel, M.D.  DATE OF BIRTH:  [birth year]   DATE OF PROCEDURE:  09/13/2005  DATE OF DISCHARGE:                                 OPERATIVE REPORT   PROCEDURE PERFORMED:  Implantation of a dual-chamber pacemaker.   SURGEON:  Doylene Canning. Ladona Ridgel, M.D.   INDICATION:  Symptomatic complete heart block with a narrow ventricular  escape at 30-40 beats per minute.   I. INTRODUCTION:  The patient is a 76 year old woman with a history of  complete heart block, who presented after several weeks of feeling dizzy and  short of breath.  She was subsequently found to have sinus rhythm with  complete heart block and a ventricular escape rate of 40 beats per minute.  She is on no A-V nodal blocking agents.  Because of multiple cardiac risk  factors as well as inferior Q waves in the setting of heart block, she  underwent catheterization  demonstrating nonobstructive coronary disease and  preserved LV function.  She now has presents for pacemaker implantation.   II. PROCEDURE:  After informed consent was obtained, the patient was taken  to the diagnostic EP lab in a fasting state.  After the usual preparation  and draping, intravenous fentanyl and midazolam were given for sedation.  Thirty milliliters of lidocaine were infiltrated into the left  infraclavicular region.  A 5-cm incision was carried out over this region  and electrocautery utilized to dissect down to the fascial plane.  Ten  milliliters of contrast were injected into the left upper extremity venous  system, demonstrating a patent left subclavian vein.  It was punctured x2  and the Medtronic model 5076 52-cm active-fixation pacing lead, serial  number [id number], was advanced to the right ventricle and the Medtronic  model 5076 45-cm  active-fixation pacing lead, serial number [id number], was  advanced to the right atrium.  Mapping was carried out in the right  ventricle.  Care was taken to place the ventricular pacing lead on the RV  septum, where R waves measured 11 mV and the pacing impedance was 784 ohms  with lead actively affixed.  Ten-volt pacing did not stimulate the  diaphragm.  The pacing threshold was 0.5 volts at 0.5 milliseconds.  With  the ventricular lead in satisfactory position, attention was then turned to  placement of the atrial lead.  It was placed in the right atrial appendage,  where P waves measured 2 mV and the pacing impedance was 627 ohms.  The  pacing threshold was 1 volt at 0.5 in the atrium.  Ten-volt pacing did not  stimulate the diaphragm.  With both atrial and ventricular leads in  satisfactory position, they were secured to the subpectoralis fascia with a  figure-of-eight silk suture.  The sew-in sleeve also utilized a silk suture.  Electrocautery was then utilized to make a subcutaneous pocket.  Kanamycin  irrigation was utilized to irrigate the pocket and electrocautery utilized  to  assure hemostasis.  The Medtronic Sigma dual-chamber pacemaker, serial  number [id number], was connected to the atrial and ventricular pacing leads  and placed in the subcutaneous pocket.  The generator was secured with silk  suture.  Additional kanamycin was utilized to irrigate the pocket and the  incision then closed with layer of 2-0 Vicryl followed by layer 3-0 Vicryl,  followed by a layer of 4-0 Vicryl.  Benzoin was painted on the skin, Steri-  Strips were applied and a pressure dressing was placed and the patient was  returned to her room in satisfactory condition.   III. COMPLICATIONS:  There were no immediate procedure complications.   IV. RESULTS:  This demonstrates successful implantation of a Medtronic dual-  chamber pacemaker in a patient with symptomatic complete heart block.            ______________________________  Doylene Canning. Ladona Ridgel, M.D.     GWT/MEDQ  D:  09/13/2005  T:  09/13/2005  Job:  [id number]   cc:   Gaspar Garbe, M.D.  Fax: [phone]

## 2011-04-08 NOTE — Assessment & Plan Note (Signed)
[facility]                           ELECTROPHYSIOLOGY OFFICE NOTE   NAME:CAMPBELLLaural Golden                    MRN:          [id number]  DATE:09/07/2006                            DOB:          03-01-1935    PACEMAKER NOTE:  Ms. Puett was seen today in the clinic on September 07, 2006 for followup of her Medtronic model #303B Sigma.   Date of implant is September 13, 2005 for complete heart block.   On interrogation of her device today, her battery voltage was 2.79, P waves  measured greater than 2.8 millivolts with an atrial capture threshold of 0.5  volts at 0.4 msec and an atrial lead impedance of 425. R waves are not  measured. She is pacemaker dependent to a rate of 30 with a ventricular  pacing threshold of 1 volt at 0.4 msec and a ventricular lead impedance of  622. No changes were made in her parameters today. She will start telephone  checks on an every 3 month basis through [employer] with a return office visit  in 1 year's time.      ______________________________  Altha Harm, LPN    ______________________________  Doylene Canning. Ladona Ridgel, MD    PO/MedQ  DD:  09/07/2006  DT:  09/09/2006  Job #:  [id number]

## 2011-04-08 NOTE — Consult Note (Signed)
[name], Valentine           ACCOUNT NO.:  [id number]   MEDICAL RECORD NO.:  [id number]          PATIENT TYPE:  INP   LOCATION:  2913                         FACILITY:  [facility]   PHYSICIAN:  Doylene Canning. Ladona Ridgel, M.D.  DATE OF BIRTH:  20-Apr-1935   DATE OF CONSULTATION:  09/12/2005  DATE OF DISCHARGE:                                   CONSULTATION   REQUESTING PHYSICIAN:  Learta Codding, M.D.   INDICATION FOR CONSULTATION:  Evaluation of complete heart block.   HISTORY OF PRESENT ILLNESS:  The patient is a 76 year old woman who was  admitted to the hospital for complaints of dizziness, lightheadedness and  shortness of breath.  Initial EKG demonstrated sinus rhythm with a complete  heart block and a narrow QRS escape, and she is admitted for additional  evaluation.  She denies any syncope.  She denies chest pain.  She denies  peripheral edema.  She does note that for the last one week she has had  increasing fatigue and weakness and decrease in her energy level.  She  denies PND or orthopnea.  She has been quite hypertensive.  In the emergency  room she was found to have a systolic blood pressure of over 200.  At the  same time, her heart rate was 30 to 35 beats per minute.  She was admitted  for additional evaluation.  As noted, she denies syncope.   ALLERGIES:  SHE GIVES A HISTORY OF ALLERGY TO SULFA DRUGS.   PAST MEDICAL HISTORY:  Notable for diabetes, hypertension, and dyslipidemia.   MEDICATIONS:  Include metformin, lisinopril/HCTZ, glyburide, Lipitor and  aspirin.   SOCIAL HISTORY:  The patient lives in [place] with her husband.  She is  married.  She denies tobacco abuse.  She denies alcohol use.   FAMILY HISTORY:  Notable for mother dying age 69 of complications of  Alzheimer's disease and father dying of lung cancer in his 5s.  She has  multiple siblings with no premature atherosclerosis.   REVIEW OF SYSTEMS:  Notable for occasional chills and fever.  She denies  any  night sweats.  She denies any weight changes.  She denies headache, vision  or hearing problems.  She denies skin rashes or problems.  She denies chest  pain, shortness of breath, dyspnea except as previously noted.  She denies  PND or orthopnea.  She has had no syncope.  She denies claudication.  She  denies urinary frequency or dysuria or hematuria. She denies nocturia.  She  denies weakness, numbness or depression.  She denies arthralgias or  arthritis.  She has occasional nausea but no vomiting or diarrhea.  She  denies melena or hematemesis.  She does have reflux symptoms.  She denies  polyuria, polydipsia, heat or cold intolerance.  The rest of her review of  systems was negative.   PHYSICAL EXAMINATION:  She is a pleasant, 76 year old woman in no distress.  The blood pressure was 170/78.  Pulse was 40 and regular.  Respirations were  16.  Temperature was 98.8.  HEENT exam is normocephalic, atraumatic.  Pupils  equal and round.  Oropharynx moist.  Sclerae were anicteric.  The neck  revealed no jugular venous distention.  There was no thyromegaly.  The  trachea was midline.  There were intermittent Lady Gary a waves present.  The  abdominal exam was soft, nontender, nondistended.  There was no  organomegaly.  The bowel sounds were present, and there was no rebound or  guarding present.  The lungs were clear bilaterally to auscultation.  There  were no wheezes, rales or rhonchi.  There was no increased work of  breathing.  The cardiovascular exam revealed a regular bradycardia with  normal S1 and S2.  PMI was not laterally displaced nor enlarged.  Extremities demonstrated no cyanosis, clubbing or edema.  The pulses were 2+  and symmetric.  The skin exam was normal.  The joint exam was normal.  Neurological exam revealed alert and oriented times three with cranial  nerves intact.  Strength was 5/5 and symmetric.  The patient denied  depression.   On EKG, she has sinus rhythm/sinus  tachycardia with complete heart block and  a narrow QRS escape at approximately 40 beats per minute.  There were  inferior Q waves which were nondiagnostic.  Other laboratory findings  revealed a creatinine of 1.1 and hemoglobin of 10.5.   IMPRESSION:  1.  Complete heart block with a junctional escape varying between 30 and 40      beats per minute which is narrow.  2.  Hypertension.  3.  Dyslipidemia.  4.  Diabetes.  5.  Small Q waves on the EKG of unknown clinical significance.   DISCUSSION:  Will plan to proceed with left heart catheterization secondary  to the concern about underlying coronary artery disease causing her complete  heart block.  Her troponin is mildly elevated at 0.09.  If there is no  obstructive coronary disease, then we will plan to proceed with permanent  pacemaker implantation.           ______________________________  Doylene Canning. Ladona Ridgel, M.D.     GWT/MEDQ  D:  09/12/2005  T:  09/12/2005  Job:  [id number]   cc:   Gaspar Garbe, M.D.  Fax: [phone]

## 2011-06-10 ENCOUNTER — Encounter: Payer: Self-pay | Admitting: Internal Medicine

## 2011-06-14 ENCOUNTER — Encounter: Payer: Self-pay | Admitting: Internal Medicine

## 2011-06-14 ENCOUNTER — Ambulatory Visit (INDEPENDENT_AMBULATORY_CARE_PROVIDER_SITE_OTHER): Payer: Medicare Other | Admitting: Internal Medicine

## 2011-06-14 DIAGNOSIS — I442 Atrioventricular block, complete: Secondary | ICD-10-CM

## 2011-06-14 DIAGNOSIS — Z950 Presence of cardiac pacemaker: Secondary | ICD-10-CM

## 2011-06-14 DIAGNOSIS — I10 Essential (primary) hypertension: Secondary | ICD-10-CM

## 2011-06-14 NOTE — Progress Notes (Signed)
HPI [name] returns today for followup. She has a long history of hypertension and symptomatic bradycardia and is status post permanent pacemaker insertion. She has underlying complete heart block. The patient denies syncope. Her blood pressure at home as been fairly well controlled typically with systolic pressures in the 120-130 range. She denies chest pain, shortness of breath, or peripheral edema. Allergies  Allergen Reactions  . Sulfa Drugs Cross Reactors      Current Outpatient Prescriptions  Medication Sig Dispense Refill  . aspirin 81 MG tablet Take 81 mg by mouth daily.        Marland Kitchen estradiol (VIVELLE-DOT) 0.0375 MG/24HR Place 1 patch onto the skin as directed.        Marland Kitchen glipiZIDE (GLUCOTROL XL) 10 MG 24 hr tablet Take 10 mg by mouth 2 (two) times daily.        Marland Kitchen lisinopril-hydrochlorothiazide (PRINZIDE,ZESTORETIC) 20-25 MG per tablet Take 1 tablet by mouth daily.        . meclizine (ANTIVERT) 25 MG tablet Take 25 mg by mouth as needed.        . metoprolol (LOPRESSOR) 50 MG tablet Take 50 mg by mouth 2 (two) times daily.        . Multiple Vitamin (MULTIVITAMIN) tablet Take 1 tablet by mouth daily.        . simvastatin (ZOCOR) 40 MG tablet Take 40 mg by mouth at bedtime.        . sitaGLIPtan-metformin (JANUMET) 50-1000 MG per tablet Take 1 tablet by mouth 2 (two) times daily.           Past Medical History  Diagnosis Date  . Dyslipidemia   . DM (diabetes mellitus)   . Pacemaker   . HTN (hypertension)   . Atrioventricular block, complete     ROS:   All systems reviewed and negative except as noted in the HPI.   Past Surgical History  Procedure Date  . Cardiac catheterization 09/12/05    Dr. Charlton Haws   . Pacemaker insertion 09/13/05    Medtronic, dual chamber. Dr. Ladona Ridgel      No family history on file.   History   Social History  . Marital Status: Married    Spouse Name: N/A    Number of Children: N/A  . Years of Education: N/A   Occupational History   . Not on file.   Social History Main Topics  . Smoking status: Never Smoker   . Smokeless tobacco: Not on file   Comment: tobacco use - no  . Alcohol Use: No  . Drug Use: Not on file  . Sexually Active: Not on file   Other Topics Concern  . Not on file   Social History Narrative   Lives in [place] with her husband.      BP 148/80  Pulse 80  Resp 14  Ht 5\' 1"  (1.549 m)  Wt 118 lb (53.524 kg)  BMI 22.30 kg/m2  Physical Exam:  Well appearing NAD HEENT: Unremarkable Neck:  No JVD, no thyromegally Lymphatics:  No adenopathy Back:  No CVA tenderness Lungs:  Clear; well-healed pacemaker incision. HEART:  Regular rate rhythm, no murmurs, no rubs, no clicks Abd:  soft, positive bowel sounds, no organomegally, no rebound, no guarding Ext:  2 plus pulses, no edema, no cyanosis, no clubbing Skin:  No rashes no nodules Neuro:  CN II through XII intact, motor grossly intact  DEVICE  Normal device function.  See PaceArt for details.   Assess/Plan:

## 2011-06-14 NOTE — Assessment & Plan Note (Signed)
Her blood pressure is slightly elevated today. At home however she notes that her pressures are well controlled. For this reason I will not increase her blood pressure medications at this time.

## 2011-06-14 NOTE — Assessment & Plan Note (Signed)
Her device is working normally. Will recheck in several months. 

## 2011-08-22 ENCOUNTER — Other Ambulatory Visit: Payer: Self-pay | Admitting: Internal Medicine

## 2011-08-22 DIAGNOSIS — Z1231 Encounter for screening mammogram for malignant neoplasm of breast: Secondary | ICD-10-CM

## 2011-09-16 ENCOUNTER — Encounter: Payer: Self-pay | Admitting: Internal Medicine

## 2011-09-16 DIAGNOSIS — I442 Atrioventricular block, complete: Secondary | ICD-10-CM

## 2011-09-19 ENCOUNTER — Ambulatory Visit
Admission: RE | Admit: 2011-09-19 | Discharge: 2011-09-19 | Disposition: A | Discharge status: Home / Self Care | Payer: Medicare Other | Source: Ambulatory Visit | Attending: Internal Medicine | Admitting: Internal Medicine

## 2011-09-19 DIAGNOSIS — Z1231 Encounter for screening mammogram for malignant neoplasm of breast: Secondary | ICD-10-CM

## 2011-11-22 HISTORY — PX: COLONOSCOPY: SHX174

## 2011-12-02 ENCOUNTER — Encounter: Payer: Self-pay | Admitting: Internal Medicine

## 2011-12-02 DIAGNOSIS — I442 Atrioventricular block, complete: Secondary | ICD-10-CM

## 2012-03-02 ENCOUNTER — Encounter: Payer: Self-pay | Admitting: Internal Medicine

## 2012-03-02 DIAGNOSIS — I442 Atrioventricular block, complete: Secondary | ICD-10-CM

## 2012-05-30 ENCOUNTER — Ambulatory Visit (INDEPENDENT_AMBULATORY_CARE_PROVIDER_SITE_OTHER): Payer: Medicare Other | Admitting: *Deleted

## 2012-05-30 ENCOUNTER — Encounter: Payer: Self-pay | Admitting: Internal Medicine

## 2012-05-30 DIAGNOSIS — I442 Atrioventricular block, complete: Secondary | ICD-10-CM

## 2012-05-30 NOTE — Progress Notes (Signed)
PPM check 

## 2012-06-01 LAB — PACEMAKER DEVICE OBSERVATION
AL AMPLITUDE: 2.8 mv
AL IMPEDENCE PM: 419 Ohm
AL THRESHOLD: 1 V
ATRIAL PACING PM: 4.8
BAMS-0001: 150 {beats}/min
BATTERY VOLTAGE: 2.78 V
RV LEAD IMPEDENCE PM: 545 Ohm
RV LEAD THRESHOLD: 1 V
VENTRICULAR PACING PM: 100

## 2012-06-27 ENCOUNTER — Encounter: Payer: Self-pay | Admitting: Gastroenterology

## 2012-07-31 ENCOUNTER — Encounter: Payer: Self-pay | Admitting: Gastroenterology

## 2012-07-31 ENCOUNTER — Other Ambulatory Visit (INDEPENDENT_AMBULATORY_CARE_PROVIDER_SITE_OTHER): Payer: Medicare Other

## 2012-07-31 ENCOUNTER — Ambulatory Visit (INDEPENDENT_AMBULATORY_CARE_PROVIDER_SITE_OTHER): Payer: Medicare Other | Admitting: Gastroenterology

## 2012-07-31 VITALS — BP 110/70 | HR 72 | Ht 59.25 in | Wt 125.0 lb

## 2012-07-31 DIAGNOSIS — D649 Anemia, unspecified: Secondary | ICD-10-CM

## 2012-07-31 DIAGNOSIS — K219 Gastro-esophageal reflux disease without esophagitis: Secondary | ICD-10-CM

## 2012-07-31 DIAGNOSIS — R195 Other fecal abnormalities: Secondary | ICD-10-CM

## 2012-07-31 DIAGNOSIS — Z79899 Other long term (current) drug therapy: Secondary | ICD-10-CM

## 2012-07-31 LAB — IBC PANEL
Iron: 63 ug/dL (ref 42–145)
Saturation Ratios: 18.1 % — ABNORMAL LOW (ref 20.0–50.0)
Transferrin: 248.1 mg/dL (ref 212.0–360.0)

## 2012-07-31 LAB — FOLATE: Folate: 18.9 ng/mL (ref >5.9)

## 2012-07-31 MED ORDER — MOVIPREP 100 G PO SOLR: 1.0000 | Freq: Once | ORAL | Status: DC

## 2012-07-31 MED ORDER — OMEPRAZOLE 20 MG PO CPDR: 20.0000 mg | DELAYED_RELEASE_CAPSULE | Freq: Every day | ORAL | Status: DC

## 2012-07-31 NOTE — Progress Notes (Addendum)
History of Present Illness: This is a 77 year old female who underwent colonoscopy in may 2011 showing 4 adenomatous colon polyps, descending colon diverticulosis and internal hemorrhoids. She is have a mild normocytic anemia with a hemoglobin of 11.3 and an MCV of 95.2. She notes frequent belching and heartburn symptoms occurring about twice each week. The symptoms have increased in frequency over the past several months. Stool Hemosure testing at Dr. Deneen Harts office was positive. Denies weight loss, abdominal pain, constipation, diarrhea, change in stool caliber, melena, hematochezia, nausea, vomiting, dysphagia, chest pain.   Current Medications, Allergies, Past Medical History, Past Surgical History, Family History and Social History were reviewed in [employer] record.  Physical Exam: General: Well developed , well nourished, no acute distress Head: Normocephalic and atraumatic Eyes:  sclerae anicteric, EOMI Ears: Normal auditory acuity Mouth: No deformity or lesions Lungs: Clear throughout to auscultation Heart: Regular rate and rhythm; no murmurs, rubs or bruits Abdomen: Soft, non tender and non distended. No masses, hepatosplenomegaly or hernias noted. Normal Bowel sounds Rectal: No external lesions her anal canal was stenotic but nontender, Hemoccult negative soft brown stool in the vault Musculoskeletal: Symmetrical with no gross deformities  Pulses:  Normal pulses noted Extremities: No clubbing, cyanosis, edema or deformities noted Neurological: Alert oriented x 4, grossly nonfocal Psychological:  Alert and cooperative. Normal mood and affect  Assessment and Recommendations:  1. Hemosure positive stool, likely from internal hemorrhoids. Anal stenosis. Given her history of multiple adenomatous colon polyps it is reasonable to exclude recurrent neoplasm. Schedule colonoscopy. The risks, benefits, and alternatives to colonoscopy with possible biopsy and possible  polypectomy were discussed with the patient and they consent to proceed.   2. Normocytic anemia. Standard serologic evaluation.  3. Personal history of adenomatous colon polyps. See #1.

## 2012-07-31 NOTE — Patient Instructions (Addendum)
Your physician has requested that you go to the basement for the following lab work before leaving today: Anemia profile.   You have been scheduled for a colonoscopy with propofol. Please follow written instructions given to you at your visit today.  Please pick up your prep kit at the pharmacy within the next 1-3 days. If you use inhalers (even only as needed), please bring them with you on the day of your procedure.  We have sent the following medications to your pharmacy for you to pick up at your convenience: Omeprazole.  Patient advised to avoid spicy, acidic, citrus, chocolate, mints, fruit and fruit juices.  Limit the intake of caffeine, alcohol and Soda.  Don't exercise too soon after eating.  Don't lie down within 3-4 hours of eating.  Elevate the head of your bed.  cc: Guerry Bruin, MD

## 2012-08-01 ENCOUNTER — Other Ambulatory Visit: Payer: Self-pay

## 2012-08-01 MED ORDER — FERROUS SULFATE 325 (65 FE) MG PO TABS: 325.0000 mg | ORAL_TABLET | Freq: Two times a day (BID) | ORAL | Status: DC

## 2012-08-22 ENCOUNTER — Other Ambulatory Visit: Payer: Self-pay | Admitting: Internal Medicine

## 2012-08-22 DIAGNOSIS — Z1231 Encounter for screening mammogram for malignant neoplasm of breast: Secondary | ICD-10-CM

## 2012-08-29 ENCOUNTER — Ambulatory Visit (AMBULATORY_SURGERY_CENTER): Payer: Medicare Other | Admitting: Gastroenterology

## 2012-08-29 ENCOUNTER — Encounter: Payer: Self-pay | Admitting: Gastroenterology

## 2012-08-29 VITALS — BP 133/61 | HR 60 | Temp 97.1°F | Resp 10 | Ht 59.0 in | Wt 125.0 lb

## 2012-08-29 DIAGNOSIS — Z86010 Personal history of colonic polyps: Secondary | ICD-10-CM

## 2012-08-29 DIAGNOSIS — D509 Iron deficiency anemia, unspecified: Secondary | ICD-10-CM

## 2012-08-29 DIAGNOSIS — R195 Other fecal abnormalities: Secondary | ICD-10-CM

## 2012-08-29 DIAGNOSIS — D126 Benign neoplasm of colon, unspecified: Secondary | ICD-10-CM

## 2012-08-29 MED ORDER — SODIUM CHLORIDE 0.9 % IV SOLN: 500.0000 mL | INTRAVENOUS | Status: DC

## 2012-08-29 NOTE — Op Note (Signed)
[facility] [address] [place], [address]   COLONOSCOPY PROCEDURE REPORT  PATIENT: [name], [name]  MR#: [id number] BIRTHDATE: March 07, 1935 , 77  yrs. old GENDER: Female ENDOSCOPIST: Meryl Dare, MD, [facility] PROCEDURE DATE:  08/29/2012 PROCEDURE:   Colonoscopy with snare polypectomy ASA CLASS:   Class II INDICATIONS:heme-positive stool, iron deficiency anemia, and patient's personal history of adenomatous colon polyps. MEDICATIONS: MAC sedation, administered by CRNA and propofol (Diprivan) 100mg  IV DESCRIPTION OF PROCEDURE:  After the risks benefits and alternatives of the procedure were thoroughly explained, informed consent was obtained.  A digital rectal exam revealed anal stenosis.  The LB [id number]-[id number]  endoscope was introduced through the anus and advanced to the cecum, which was identified by both the appendix and ileocecal valve. No adverse events experienced.  The quality of the prep was good, using MoviPrep. The instrument was then slowly withdrawn as the colon was fully examined.   COLON FINDINGS: Mild diverticulosis was noted in the ascending colon.   A sessile polyp measuring 1 cm in size was found in the descending colon.  A polypectomy was performed using snare cautery. The resection was complete and the polyp tissue was completely retrieved.   The colon was otherwise normal.  There was no diverticulosis, inflammation, polyps or cancers unless previously stated.  Retroflexed views revealed moderate internal hemorrhoids. The time to cecum=1 minutes 29 seconds.  Withdrawal time=8 minutes 35 seconds.  The scope was withdrawn and the procedure completed. COMPLICATIONS: There were no complications.  ENDOSCOPIC IMPRESSION: 1.   Mild diverticulosis was noted in the ascending colon 2.   Sessile polyp in the descending colon; polypectomy was performed using snare cautery 3.   Moderate internal hemorrhoids  RECOMMENDATIONS: 1.  Hold  aspirin, aspirin products, and anti-inflammatory medication for 2 weeks. 2.  Await pathology results 3.  Repeat Colonoscopy in 3 years 4.  If fe deficiency does not correct or returns would proceed with EGD  eSigned:  Meryl Dare, MD, [facility] 08/29/2012 11:39 AM   cc: Guerry Bruin, MD

## 2012-08-29 NOTE — Patient Instructions (Signed)
HOLD ASPIRIN PRODUCTS, IBUPROFEN AND NSAIDS FOR TWO WEEKS, October 23,2013    YOU HAD AN ENDOSCOPIC PROCEDURE TODAY AT THE [facility]: Refer to the procedure report that was given to you for any specific questions about what was found during the examination.  If the procedure report does not answer your questions, please call your gastroenterologist to clarify.  If you requested that your care partner not be given the details of your procedure findings, then the procedure report has been included in a sealed envelope for you to review at your convenience later.  YOU SHOULD EXPECT: Some feelings of bloating in the abdomen. Passage of more gas than usual.  Walking can help get rid of the air that was put into your GI tract during the procedure and reduce the bloating. If you had a lower endoscopy (such as a colonoscopy or flexible sigmoidoscopy) you may notice spotting of blood in your stool or on the toilet paper. If you underwent a bowel prep for your procedure, then you may not have a normal bowel movement for a few days.  DIET: Your first meal following the procedure should be a light meal and then it is ok to progress to your normal diet.  A half-sandwich or bowl of soup is an example of a good first meal.  Heavy or fried foods are harder to digest and may make you feel nauseous or bloated.  Likewise meals heavy in dairy and vegetables can cause extra gas to form and this can also increase the bloating.  Drink plenty of fluids but you should avoid alcoholic beverages for 24 hours.  ACTIVITY: Your care partner should take you home directly after the procedure.  You should plan to take it easy, moving slowly for the rest of the day.  You can resume normal activity the day after the procedure however you should NOT DRIVE or use heavy machinery for 24 hours (because of the sedation medicines used during the test).    SYMPTOMS TO REPORT IMMEDIATELY: A gastroenterologist can be reached at any  hour.  During normal business hours, 8:30 AM to 5:00 PM Monday through Friday, call [phone].  After hours and on weekends, please call the GI answering service at [phone] who will take a message and have the physician on call contact you.   Following lower endoscopy (colonoscopy or flexible sigmoidoscopy):  Excessive amounts of blood in the stool  Significant tenderness or worsening of abdominal pains  Swelling of the abdomen that is new, acute  Fever of 100F or higher  FOLLOW UP: If any biopsies were taken you will be contacted by phone or by letter within the next 1-3 weeks.  Call your gastroenterologist if you have not heard about the biopsies in 3 weeks.  Our staff will call the home number listed on your records the next business day following your procedure to check on you and address any questions or concerns that you may have at that time regarding the information given to you following your procedure. This is a courtesy call and so if there is no answer at the home number and we have not heard from you through the emergency physician on call, we will assume that you have returned to your regular daily activities without incident.  SIGNATURES/CONFIDENTIALITY: You and/or your care partner have signed paperwork which will be entered into your electronic medical record.  These signatures attest to the fact that that the information above on your After Visit Summary  has been reviewed and is understood.  Full responsibility of the confidentiality of this discharge information lies with you and/or your care-partner.  

## 2012-08-29 NOTE — Progress Notes (Signed)
Patient did not experience any of the following events: a burn prior to discharge; a fall within the facility; wrong site/side/patient/procedure/implant event; or a hospital transfer or hospital admission upon discharge from the facility. (G8907) Patient did not have preoperative order for IV antibiotic SSI prophylaxis. (G8918)  

## 2012-08-30 ENCOUNTER — Telehealth: Payer: Self-pay | Admitting: *Deleted

## 2012-08-30 NOTE — Telephone Encounter (Signed)
  Follow up Call-  Call back number 08/29/2012  Post procedure Call Back phone  # [phone]  Permission to leave phone message Yes     Patient questions:  Do you have a fever, pain , or abdominal swelling? no Pain Score  0 *  Have you tolerated food without any problems? yes  Have you been able to return to your normal activities? yes  Do you have any questions about your discharge instructions: Diet   no Medications  no Follow up visit  no  Do you have questions or concerns about your Care? no  Actions: * If pain score is 4 or above: No action needed, pain <4.

## 2012-08-31 DIAGNOSIS — I442 Atrioventricular block, complete: Secondary | ICD-10-CM

## 2012-09-04 ENCOUNTER — Encounter: Payer: Self-pay | Admitting: Gastroenterology

## 2012-09-19 ENCOUNTER — Ambulatory Visit
Admission: RE | Admit: 2012-09-19 | Discharge: 2012-09-19 | Disposition: A | Discharge status: Home / Self Care | Payer: Medicare Other | Source: Ambulatory Visit | Attending: Internal Medicine | Admitting: Internal Medicine

## 2012-09-19 DIAGNOSIS — Z1231 Encounter for screening mammogram for malignant neoplasm of breast: Secondary | ICD-10-CM

## 2012-11-27 ENCOUNTER — Encounter: Payer: Self-pay | Admitting: Internal Medicine

## 2012-11-27 ENCOUNTER — Ambulatory Visit (INDEPENDENT_AMBULATORY_CARE_PROVIDER_SITE_OTHER): Payer: Medicare Other | Admitting: Internal Medicine

## 2012-11-27 VITALS — BP 145/75 | HR 66 | Ht 60.0 in | Wt 127.0 lb

## 2012-11-27 DIAGNOSIS — I442 Atrioventricular block, complete: Secondary | ICD-10-CM

## 2012-11-27 DIAGNOSIS — Z950 Presence of cardiac pacemaker: Secondary | ICD-10-CM

## 2012-11-27 DIAGNOSIS — I10 Essential (primary) hypertension: Secondary | ICD-10-CM

## 2012-11-27 LAB — PACEMAKER DEVICE OBSERVATION
ATRIAL PACING PM: 3.4
BAMS-0001: 150 {beats}/min
RV LEAD IMPEDENCE PM: 593 Ohm
RV LEAD THRESHOLD: 1 V

## 2012-11-27 NOTE — Progress Notes (Signed)
HPI [name] returns today for followup. She is a 78 year old woman with a history of bleeding heart block, hypertension, and dyslipidemia, status post permanent pacemaker insertion. In the interim, she has been stable. She denies chest pain or shortness of breath. No syncope and no peripheral edema. Allergies  Allergen Reactions  . Sulfa Drugs Cross Reactors      Current Outpatient Prescriptions  Medication Sig Dispense Refill  . aspirin 81 MG tablet Take 81 mg by mouth daily.        Marland Kitchen estradiol (VIVELLE-DOT) 0.0375 MG/24HR Place 1 patch onto the skin as directed.        . ferrous sulfate 325 (65 FE) MG tablet Take 1 tablet (325 mg total) by mouth 2 (two) times daily.  60 tablet  0  . glipiZIDE (GLUCOTROL XL) 10 MG 24 hr tablet Take 10 mg by mouth 2 (two) times daily.        Marland Kitchen lisinopril-hydrochlorothiazide (PRINZIDE,ZESTORETIC) 20-25 MG per tablet Take 1 tablet by mouth daily.        . meclizine (ANTIVERT) 25 MG tablet Take 25 mg by mouth as needed.        . metoprolol (LOPRESSOR) 50 MG tablet Take 50 mg by mouth 2 (two) times daily.        . Multiple Vitamin (MULTIVITAMIN) tablet Take 1 tablet by mouth daily.        Marland Kitchen omeprazole (PRILOSEC) 20 MG capsule Take 1 capsule (20 mg total) by mouth daily.  30 capsule  11  . pioglitazone (ACTOS) 15 MG tablet Take 15 mg by mouth daily.      . simvastatin (ZOCOR) 40 MG tablet Take 40 mg by mouth at bedtime.        . sitaGLIPtan-metformin (JANUMET) 50-1000 MG per tablet Take 1 tablet by mouth 2 (two) times daily.          Past Medical History  Diagnosis Date  . DM (diabetes mellitus)   . Pacemaker   . HTN (hypertension)   . Atrioventricular block, complete   . Tubular adenoma of colon 03/2010  . Diverticulosis   . Hemorrhoids   . Hyperlipidemia   . Osteoporosis     ROS:   All systems reviewed and negative except as noted in the HPI.   Past Surgical History  Procedure Date  . Cardiac catheterization 09/12/05    Dr. Charlton Haws   . Pacemaker insertion 09/13/05    Medtronic, dual chamber. Dr. Ladona Ridgel   . Total abdominal hysterectomy w/ bilateral salpingoophorectomy   . Brain biopsy 1998    Benign     Family History  Problem Relation Age of Onset  . Diabetes Father   . Lung cancer Father      History   Social History  . Marital Status: Married    Spouse Name: N/A    Number of Children: 1  . Years of Education: N/A   Occupational History  . Retired from [employer]    Social History Main Topics  . Smoking status: Never Smoker   . Smokeless tobacco: Never Used     Comment: tobacco use - no  . Alcohol Use: No  . Drug Use: No  . Sexually Active: Not on file   Other Topics Concern  . Not on file   Social History Narrative   Lives in [place] with her husband.      BP 145/75  Pulse 66  Ht 5' (1.524 m)  Wt 127 lb (57.607 kg)  BMI 24.80 kg/m2  Physical Exam:  Well appearing 78 year old woman, NAD HEENT: Unremarkable Neck:  No JVD, no thyromegally Lungs:  Clear with no wheezes, rales, or rhonchi. HEART:  Regular rate rhythm, no murmurs, no rubs, no clicks Abd:  soft, positive bowel sounds, no organomegally, no rebound, no guarding Ext:  2 plus pulses, no edema, no cyanosis, no clubbing Skin:  No rashes no nodules Neuro:  CN II through XII intact, motor grossly intact  EKG sinus rhythm with AV sequential pacing  DEVICE  Normal device function.  See PaceArt for details.   Assess/Plan:

## 2012-11-27 NOTE — Patient Instructions (Signed)
Your physician wants you to follow-up in: 1 year with Dr. Taylor. You will receive a reminder letter in the mail two months in advance. If you don't receive a letter, please call our office to schedule the follow-up appointment.  

## 2012-11-27 NOTE — Assessment & Plan Note (Signed)
Her blood pressure is minimally elevated. She will continue her current medical therapy. I've discussed the importance of a low-sodium diet.

## 2012-11-27 NOTE — Assessment & Plan Note (Signed)
Her Medtronic dual-chamber pacemaker is working normally. We will plan to recheck in several months. Her underlying rhythm is complete heart block with no escape at 30 beats per minute.

## 2013-03-01 DIAGNOSIS — I442 Atrioventricular block, complete: Secondary | ICD-10-CM

## 2013-05-31 ENCOUNTER — Encounter: Payer: Self-pay | Admitting: Internal Medicine

## 2013-05-31 DIAGNOSIS — I442 Atrioventricular block, complete: Secondary | ICD-10-CM

## 2013-08-29 ENCOUNTER — Other Ambulatory Visit: Payer: Self-pay

## 2013-08-29 DIAGNOSIS — Z1231 Encounter for screening mammogram for malignant neoplasm of breast: Secondary | ICD-10-CM

## 2013-08-30 ENCOUNTER — Encounter: Payer: Self-pay | Admitting: Internal Medicine

## 2013-08-30 DIAGNOSIS — I442 Atrioventricular block, complete: Secondary | ICD-10-CM

## 2013-09-20 ENCOUNTER — Ambulatory Visit
Admission: RE | Admit: 2013-09-20 | Discharge: 2013-09-20 | Disposition: A | Discharge status: Home / Self Care | Payer: Medicare Other | Source: Ambulatory Visit

## 2013-09-20 DIAGNOSIS — Z1231 Encounter for screening mammogram for malignant neoplasm of breast: Secondary | ICD-10-CM

## 2013-11-29 ENCOUNTER — Encounter (INDEPENDENT_AMBULATORY_CARE_PROVIDER_SITE_OTHER): Payer: Self-pay

## 2013-11-29 ENCOUNTER — Encounter: Payer: Self-pay | Admitting: Internal Medicine

## 2013-11-29 ENCOUNTER — Ambulatory Visit (INDEPENDENT_AMBULATORY_CARE_PROVIDER_SITE_OTHER): Payer: Medicare Other | Admitting: Internal Medicine

## 2013-11-29 VITALS — BP 142/80 | HR 62 | Ht 60.0 in | Wt 127.8 lb

## 2013-11-29 DIAGNOSIS — I442 Atrioventricular block, complete: Secondary | ICD-10-CM

## 2013-11-29 DIAGNOSIS — Z950 Presence of cardiac pacemaker: Secondary | ICD-10-CM

## 2013-11-29 DIAGNOSIS — I10 Essential (primary) hypertension: Secondary | ICD-10-CM

## 2013-11-29 DIAGNOSIS — R252 Cramp and spasm: Secondary | ICD-10-CM | POA: Insufficient documentation

## 2013-11-29 LAB — MDC_IDC_ENUM_SESS_TYPE_INCLINIC
Battery Voltage: 2.77 V
Brady Statistic AP VP Percent: 4.1 %
Lead Channel Pacing Threshold Amplitude: 1 V
Lead Channel Pacing Threshold Amplitude: 1 V
Lead Channel Pacing Threshold Pulse Width: 0.4 ms
Lead Channel Setting Pacing Amplitude: 2 V
Lead Channel Setting Pacing Pulse Width: 0.4 ms
Lead Channel Setting Sensing Sensitivity: 2.8 mV
MDC IDC MSMT LEADCHNL RA IMPEDANCE VALUE: 421 Ohm
MDC IDC MSMT LEADCHNL RA PACING THRESHOLD PULSEWIDTH: 0.4 ms
MDC IDC MSMT LEADCHNL RA SENSING INTR AMPL: 2.8 mV
MDC IDC MSMT LEADCHNL RV IMPEDANCE VALUE: 587 Ohm
MDC IDC SET LEADCHNL RV PACING AMPLITUDE: 2.5 V
MDC IDC STAT BRADY AP VS PERCENT: 0.1 % — AB
MDC IDC STAT BRADY AS VP PERCENT: 95.9 %
MDC IDC STAT BRADY AS VS PERCENT: 0.1 % — AB

## 2013-11-29 NOTE — Assessment & Plan Note (Signed)
I suspect her cramps are related to her diuretic therapy. I've asked the patient to increase her potassium intake by drinking orange juice at night just before going to bed. If this does not improve her cramps, we will consider stopping her hydrochlorothiazide.

## 2013-11-29 NOTE — Assessment & Plan Note (Signed)
Her Medtronic dual-chamber pacemaker is working normally. We'll plan to recheck in several months. 

## 2013-11-29 NOTE — Patient Instructions (Signed)
Your physician wants you to follow-up in: [place] will receive a reminder letter in the mail two months in advance. If you don't receive a letter, please call our office to schedule the follow-up appointment.

## 2013-11-29 NOTE — Assessment & Plan Note (Signed)
Her blood pressure is slightly elevated today. I've discussed the importance of a low-salt diet. She missed to sodium indiscretion. I've also encouraged the patient increase her physical activity.

## 2013-11-29 NOTE — Progress Notes (Signed)
HPI [name] returns today for followup. She is a 79 year old woman with a history of complete heart block, hypertension, and dyslipidemia, status post permanent pacemaker insertion. In the interim, she has been stable. She denies chest pain or shortness of breath. No syncope and no peripheral edema. In the interim she c/o leg cramps but has been well otherwise.  Allergies  Allergen Reactions  . Sulfa Drugs Cross Reactors      Current Outpatient Prescriptions  Medication Sig Dispense Refill  . aspirin 81 MG tablet Take 81 mg by mouth daily.        Marland Kitchen estradiol (VIVELLE-DOT) 0.0375 MG/24HR Place 1 patch onto the skin as directed.        . ferrous sulfate 325 (65 FE) MG tablet Take 1 tablet (325 mg total) by mouth 2 (two) times daily.  60 tablet  0  . glipiZIDE (GLUCOTROL XL) 10 MG 24 hr tablet Take 10 mg by mouth 2 (two) times daily.        Marland Kitchen lisinopril-hydrochlorothiazide (PRINZIDE,ZESTORETIC) 20-25 MG per tablet Take 1 tablet by mouth daily.        . meclizine (ANTIVERT) 25 MG tablet Take 25 mg by mouth as needed.        . metoprolol (LOPRESSOR) 50 MG tablet Take 50 mg by mouth 2 (two) times daily.        . Multiple Vitamin (MULTIVITAMIN) tablet Take 1 tablet by mouth daily.        . pioglitazone (ACTOS) 15 MG tablet Take 15 mg by mouth daily.      . simvastatin (ZOCOR) 40 MG tablet Take 40 mg by mouth at bedtime.        . sitaGLIPtan-metformin (JANUMET) 50-1000 MG per tablet Take 1 tablet by mouth 2 (two) times daily.        No current facility-administered medications for this visit.     Past Medical History  Diagnosis Date  . DM (diabetes mellitus)   . Pacemaker   . HTN (hypertension)   . Atrioventricular block, complete   . Tubular adenoma of colon 03/2010  . Diverticulosis   . Hemorrhoids   . Hyperlipidemia   . Osteoporosis     ROS:   All systems reviewed and negative except as noted in the HPI.   Past Surgical History  Procedure Laterality Date  . Cardiac  catheterization  09/12/05    Dr. Jenkins Rouge   . Pacemaker insertion  09/13/05    Medtronic, dual chamber. Dr. Lovena Le   . Total abdominal hysterectomy w/ bilateral salpingoophorectomy    . Brain biopsy  1998    Benign     Family History  Problem Relation Age of Onset  . Diabetes Father   . Lung cancer Father      History   Social History  . Marital Status: Married    Spouse Name: N/A    Number of Children: 1  . Years of Education: N/A   Occupational History  . Retired from [employer]  . Smoking status: Never Smoker   . Smokeless tobacco: Never Used     Comment: tobacco use - no  . Alcohol Use: No  . Drug Use: No  . Sexual Activity: Not on file   Other Topics Concern  . Not on file   Social History Narrative   Lives in [place] with her husband.      BP 142/80  Pulse 62  Ht 5' (1.524 m)  Wt 127 lb 12.8 oz (57.97 kg)  BMI 24.96 kg/m2  Physical Exam:  Well appearing 79 year old woman, NAD HEENT: Unremarkable Neck:  No JVD, no thyromegally Lungs:  Clear with no wheezes, rales, or rhonchi. HEART:  Regular rate rhythm, no murmurs, no rubs, no clicks Abd:  soft, positive bowel sounds, no organomegally, no rebound, no guarding Ext:  2 plus pulses, no edema, no cyanosis, no clubbing Skin:  No rashes no nodules Neuro:  CN II through XII intact, motor grossly intact  EKG - sinus rhythm with AV sequential pacing  DEVICE  Normal device function.  See PaceArt for details.   Assess/Plan:

## 2013-12-05 ENCOUNTER — Encounter: Payer: Self-pay | Admitting: Internal Medicine

## 2014-03-07 ENCOUNTER — Encounter: Payer: Self-pay | Admitting: Internal Medicine

## 2014-03-07 DIAGNOSIS — I442 Atrioventricular block, complete: Secondary | ICD-10-CM

## 2014-06-05 DIAGNOSIS — I442 Atrioventricular block, complete: Secondary | ICD-10-CM

## 2014-06-06 ENCOUNTER — Encounter: Payer: Self-pay | Admitting: Internal Medicine

## 2014-08-26 ENCOUNTER — Other Ambulatory Visit: Payer: Self-pay

## 2014-08-26 DIAGNOSIS — Z1231 Encounter for screening mammogram for malignant neoplasm of breast: Secondary | ICD-10-CM

## 2014-08-29 DIAGNOSIS — I442 Atrioventricular block, complete: Secondary | ICD-10-CM

## 2014-09-23 ENCOUNTER — Ambulatory Visit
Admission: RE | Admit: 2014-09-23 | Discharge: 2014-09-23 | Disposition: A | Discharge status: Home / Self Care | Payer: Medicare Other | Source: Ambulatory Visit

## 2014-09-23 DIAGNOSIS — Z1231 Encounter for screening mammogram for malignant neoplasm of breast: Secondary | ICD-10-CM

## 2014-12-02 ENCOUNTER — Ambulatory Visit (INDEPENDENT_AMBULATORY_CARE_PROVIDER_SITE_OTHER): Payer: Medicare Other | Admitting: Internal Medicine

## 2014-12-02 ENCOUNTER — Encounter: Payer: Self-pay | Admitting: Internal Medicine

## 2014-12-02 VITALS — BP 128/82 | HR 76 | Ht <= 58 in | Wt 128.0 lb

## 2014-12-02 DIAGNOSIS — I10 Essential (primary) hypertension: Secondary | ICD-10-CM

## 2014-12-02 DIAGNOSIS — Z950 Presence of cardiac pacemaker: Secondary | ICD-10-CM

## 2014-12-02 DIAGNOSIS — I442 Atrioventricular block, complete: Secondary | ICD-10-CM

## 2014-12-02 LAB — MDC_IDC_ENUM_SESS_TYPE_INCLINIC
Battery Impedance: 1899 Ohm
Battery Remaining Longevity: 33 mo
Brady Statistic AP VP Percent: 5.4 %
Brady Statistic AS VS Percent: 0.1 %
Date Time Interrogation Session: 20160112092330
Lead Channel Impedance Value: 372 Ohm
Lead Channel Pacing Threshold Amplitude: 1 V
Lead Channel Setting Pacing Amplitude: 2 V
Lead Channel Setting Pacing Amplitude: 2.5 V
Lead Channel Setting Pacing Pulse Width: 0.4 ms
Lead Channel Setting Sensing Sensitivity: 2.8 mV
MDC IDC MSMT BATTERY VOLTAGE: 2.76 V
MDC IDC MSMT LEADCHNL RA PACING THRESHOLD AMPLITUDE: 1 V
MDC IDC MSMT LEADCHNL RA PACING THRESHOLD PULSEWIDTH: 0.4 ms
MDC IDC MSMT LEADCHNL RA SENSING INTR AMPL: 2.8 mV
MDC IDC MSMT LEADCHNL RV IMPEDANCE VALUE: 659 Ohm
MDC IDC MSMT LEADCHNL RV PACING THRESHOLD PULSEWIDTH: 0.4 ms
MDC IDC STAT BRADY AP VS PERCENT: 0.1 %
MDC IDC STAT BRADY AS VP PERCENT: 94.6 %

## 2014-12-02 NOTE — Assessment & Plan Note (Signed)
Her blood pressure is well controlled. She will continue her current meds and maintain a low sodium diet. I have asked that she continue to exercise regularly.

## 2014-12-02 NOTE — Patient Instructions (Signed)
Your physician wants you to follow-up in: 6 months in the device clinic and 12 months with Dr. Knox Saliva will receive a reminder letter in the mail two months in advance. If you don't receive a letter, please call our office to schedule the follow-up appointment.

## 2014-12-02 NOTE — Assessment & Plan Note (Signed)
Her DDD Medtronic PPM is working normally. Will recheck in several months.

## 2014-12-02 NOTE — Assessment & Plan Note (Signed)
She will continue her simvastatin and a low fat diet. No change in meds.

## 2014-12-02 NOTE — Progress Notes (Signed)
HPI [name] returns today for followup. She is a 80 year old woman with a history of complete heart block, hypertension, and dyslipidemia, status post permanent pacemaker insertion. In the interim, she has been stable. She denies chest pain or shortness of breath. No syncope and no peripheral edema. In the interim, she has begun exercising 4 times a week. No other complaints.  Allergies  Allergen Reactions  . Sulfa Drugs Cross Reactors     Swelling, blisters and itching     Current Outpatient Prescriptions  Medication Sig Dispense Refill  . aspirin 81 MG tablet Take 81 mg by mouth daily.      . ferrous sulfate 325 (65 FE) MG tablet Take 1 tablet (325 mg total) by mouth 2 (two) times daily. 60 tablet 0  . glipiZIDE (GLUCOTROL XL) 10 MG 24 hr tablet Take 10 mg by mouth 2 (two) times daily.      Marland Kitchen lisinopril-hydrochlorothiazide (PRINZIDE,ZESTORETIC) 20-25 MG per tablet Take 1 tablet by mouth daily.      . meclizine (ANTIVERT) 25 MG tablet Take 25 mg by mouth 2 (two) times daily as needed for dizziness or nausea.     . metoprolol (LOPRESSOR) 50 MG tablet Take 50 mg by mouth 2 (two) times daily.      . Multiple Vitamin (MULTIVITAMIN) tablet Take 1 tablet by mouth daily.      . pioglitazone (ACTOS) 15 MG tablet Take 15 mg by mouth daily.    . simvastatin (ZOCOR) 40 MG tablet Take 40 mg by mouth at bedtime.      . sitaGLIPtan-metformin (JANUMET) 50-1000 MG per tablet Take 1 tablet by mouth 2 (two) times daily.      No current facility-administered medications for this visit.     Past Medical History  Diagnosis Date  . DM (diabetes mellitus)   . Pacemaker   . HTN (hypertension)   . Atrioventricular block, complete   . Tubular adenoma of colon 03/2010  . Diverticulosis   . Hemorrhoids   . Hyperlipidemia   . Osteoporosis     ROS:   All systems reviewed and negative except as noted in the HPI.   Past Surgical History  Procedure Laterality Date  . Cardiac catheterization   09/12/05    Dr. Jenkins Rouge   . Pacemaker insertion  09/13/05    Medtronic, dual chamber. Dr. Lovena Le   . Total abdominal hysterectomy w/ bilateral salpingoophorectomy    . Brain biopsy  1998    Benign     Family History  Problem Relation Age of Onset  . Diabetes Father   . Lung cancer Father      History   Social History  . Marital Status: Married    Spouse Name: N/A    Number of Children: 1  . Years of Education: N/A   Occupational History  . Retired from [employer]  . Smoking status: Never Smoker   . Smokeless tobacco: Never Used     Comment: tobacco use - no  . Alcohol Use: No  . Drug Use: No  . Sexual Activity: Not on file   Other Topics Concern  . Not on file   Social History Narrative   Lives in [place] with her husband.      BP 128/82 mmHg  Pulse 76  Ht 4\' 10"  (1.473 m)  Wt 128 lb (58.06 kg)  BMI 26.76 kg/m2  Physical Exam:  Well appearing 80 year old woman, NAD HEENT: Unremarkable Neck:  6 cm JVD, no thyromegally Lungs:  Clear with no wheezes, rales, or rhonchi. HEART:  Regular rate rhythm, no murmurs, no rubs, no clicks Abd:  soft, positive bowel sounds, no organomegally, no rebound, no guarding Ext:  2 plus pulses, no edema, no cyanosis, no clubbing Skin:  No rashes no nodules Neuro:  CN II through XII intact, motor grossly intact  DEVICE  Normal device function.  See PaceArt for details.   Assess/Plan:

## 2014-12-11 ENCOUNTER — Encounter: Payer: Self-pay | Admitting: Internal Medicine

## 2015-03-06 DIAGNOSIS — I442 Atrioventricular block, complete: Secondary | ICD-10-CM | POA: Diagnosis not present

## 2015-06-01 ENCOUNTER — Ambulatory Visit (INDEPENDENT_AMBULATORY_CARE_PROVIDER_SITE_OTHER): Payer: Medicare Other | Admitting: *Deleted

## 2015-06-01 DIAGNOSIS — I442 Atrioventricular block, complete: Secondary | ICD-10-CM | POA: Diagnosis not present

## 2015-06-01 LAB — CUP PACEART INCLINIC DEVICE CHECK
Brady Statistic AP VS Percent: 0.1 %
Date Time Interrogation Session: 20160711101521
Lead Channel Impedance Value: 401 Ohm
Lead Channel Pacing Threshold Amplitude: 1 V
Lead Channel Pacing Threshold Amplitude: 1 V
Lead Channel Pacing Threshold Pulse Width: 0.4 ms
Lead Channel Pacing Threshold Pulse Width: 0.4 ms
Lead Channel Setting Pacing Amplitude: 2 V
Lead Channel Setting Sensing Sensitivity: 2.8 mV
MDC IDC MSMT LEADCHNL RA SENSING INTR AMPL: 2.8 mV
MDC IDC MSMT LEADCHNL RV IMPEDANCE VALUE: 620 Ohm
MDC IDC MSMT LEADCHNL RV SENSING INTR AMPL: 11.2 mV
MDC IDC SET LEADCHNL RV PACING AMPLITUDE: 2.5 V
MDC IDC SET LEADCHNL RV PACING PULSEWIDTH: 0.4 ms
MDC IDC STAT BRADY AP VP PERCENT: 3.6 %
MDC IDC STAT BRADY AS VP PERCENT: 96.4 %
MDC IDC STAT BRADY AS VS PERCENT: 0.1 %

## 2015-06-01 NOTE — Progress Notes (Signed)
Pacemaker check in clinic. Normal device function. Thresholds, sensing, impedances consistent with previous measurements. Device programmed to maximize longevity. No mode switch or high ventricular rates noted. Device programmed at appropriate safety margins. Histogram distribution appropriate for patient activity level. Device programmed to optimize intrinsic conduction. Estimated longevity 1-3.5 years. ROV with GT in January.

## 2015-06-29 ENCOUNTER — Encounter: Payer: Self-pay | Admitting: Internal Medicine

## 2015-07-06 ENCOUNTER — Encounter: Payer: Self-pay | Admitting: Gastroenterology

## 2015-07-14 ENCOUNTER — Encounter: Payer: Self-pay | Admitting: Gastroenterology

## 2015-08-25 ENCOUNTER — Other Ambulatory Visit: Payer: Self-pay

## 2015-08-25 DIAGNOSIS — Z1231 Encounter for screening mammogram for malignant neoplasm of breast: Secondary | ICD-10-CM

## 2015-09-04 DIAGNOSIS — I442 Atrioventricular block, complete: Secondary | ICD-10-CM | POA: Diagnosis not present

## 2015-09-25 ENCOUNTER — Ambulatory Visit
Admission: RE | Admit: 2015-09-25 | Discharge: 2015-09-25 | Disposition: A | Discharge status: Home / Self Care | Payer: Medicare Other | Source: Ambulatory Visit

## 2015-09-25 DIAGNOSIS — Z1231 Encounter for screening mammogram for malignant neoplasm of breast: Secondary | ICD-10-CM

## 2015-11-22 ENCOUNTER — Emergency Department (HOSPITAL_COMMUNITY)
Admission: EM | Admit: 2015-11-22 | Discharge: 2015-11-22 | Disposition: A | Discharge status: Home / Self Care | Payer: Medicare Other | Attending: Emergency Medicine | Admitting: Emergency Medicine

## 2015-11-22 ENCOUNTER — Encounter (HOSPITAL_COMMUNITY): Payer: Self-pay | Admitting: *Deleted

## 2015-11-22 DIAGNOSIS — E119 Type 2 diabetes mellitus without complications: Secondary | ICD-10-CM | POA: Insufficient documentation

## 2015-11-22 DIAGNOSIS — E785 Hyperlipidemia, unspecified: Secondary | ICD-10-CM | POA: Insufficient documentation

## 2015-11-22 DIAGNOSIS — Z7982 Long term (current) use of aspirin: Secondary | ICD-10-CM | POA: Insufficient documentation

## 2015-11-22 DIAGNOSIS — Z950 Presence of cardiac pacemaker: Secondary | ICD-10-CM | POA: Insufficient documentation

## 2015-11-22 DIAGNOSIS — M79671 Pain in right foot: Secondary | ICD-10-CM | POA: Diagnosis present

## 2015-11-22 DIAGNOSIS — M10071 Idiopathic gout, right ankle and foot: Secondary | ICD-10-CM | POA: Insufficient documentation

## 2015-11-22 DIAGNOSIS — Z79899 Other long term (current) drug therapy: Secondary | ICD-10-CM | POA: Insufficient documentation

## 2015-11-22 DIAGNOSIS — I10 Essential (primary) hypertension: Secondary | ICD-10-CM | POA: Insufficient documentation

## 2015-11-22 DIAGNOSIS — M109 Gout, unspecified: Secondary | ICD-10-CM

## 2015-11-22 MED ORDER — INDOMETHACIN 50 MG PO CAPS: ORAL_CAPSULE | ORAL | Status: DC

## 2015-11-22 NOTE — ED Notes (Signed)
Declined W/C at D/C and was escorted to lobby by RN. 

## 2015-11-22 NOTE — ED Provider Notes (Signed)
Medical screening examination/treatment/procedure(s) were conducted as a shared visit with non-physician practitioner(s) and myself.  I personally evaluated the patient during the encounter.   EKG Interpretation None     81 year old female with pain in right first MP joint. This is likely gout. Atraumatic. 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 She is neurovascularly intact. Clinically doubt infection. She has a history of diabetes but no obvious breaks in skin or other possible portable entry noted. Will treat presumptively for gout. Return precautions were discussed.   Virgel Manifold, MD 11/22/15 [phone]

## 2015-11-22 NOTE — ED Notes (Signed)
Pt reports waking up this AM with pain to RT great toe.

## 2015-11-22 NOTE — ED Provider Notes (Signed)
CSN: [id number]     Arrival date & time 11/22/15  0913 History  By signing my name below, I, Starleen Arms, attest that this documentation has been prepared under the direction and in the presence of Cas[place], PA-C. Electronically Signed: Starleen Arms ED Scribe. 11/22/2015. 9:30 AM.    Chief Complaint  Patient presents with  . Toe Pain   The history is provided by the patient and medical records. No language interpreter was used.    HPI Comments: [name] is a 81 y.o. female with hx of DM, HTN who presents to the Emergency Department complaining of constant, moderate right great toe pain and swelling onset this morning that awoke her from sleep. She reports a hx of prior, less severe episodes but has not been previously evaluatated. Patient denies recent alcohol or red meat consumption.  She denies CP, abdominal pain, fever, drainage, other complaints.     Past Medical History  Diagnosis Date  . DM (diabetes mellitus) ([place])   . Pacemaker   . HTN (hypertension)   . Atrioventricular block, complete ([place])   . Tubular adenoma of colon 03/2010  . Diverticulosis   . Hemorrhoids   . Hyperlipidemia   . Osteoporosis    Past Surgical History  Procedure Laterality Date  . Cardiac catheterization  09/12/05    Dr. Jenkins Rouge   . Pacemaker insertion  09/13/05    Medtronic, dual chamber. Dr. Lovena Le   . Total abdominal hysterectomy w/ bilateral salpingoophorectomy    . Brain biopsy  1998    Benign   Family History  Problem Relation Age of Onset  . Diabetes Father   . Lung cancer Father    Social History  Substance Use Topics  . Smoking status: Never Smoker   . Smokeless tobacco: Never Used     Comment: tobacco use - no  . Alcohol Use: No   OB History    No data available     Review of Systems 10 Systems reviewed and all are negative for acute change except as noted in the HPI.   Allergies  Sulfa drugs cross reactors  Home Medications   Prior to Admission  medications   Medication Sig Start Date End Date Taking? Authorizing Provider  aspirin 81 MG tablet Take 81 mg by mouth daily.      Historical Provider, MD  ferrous sulfate 325 (65 FE) MG tablet Take 1 tablet (325 mg total) by mouth 2 (two) times daily. 08/01/12 06/01/15  Ladene Artist, MD  glipiZIDE (GLUCOTROL XL) 10 MG 24 hr tablet Take 10 mg by mouth 2 (two) times daily.      Historical Provider, MD  indomethacin (INDOCIN) 50 MG capsule Take one tablet three times a day until pain subsides, then take once daily for the next two to three days. 11/22/15   Jaxden Blyden Pilcher Onyx Edgley, PA-C  lisinopril-hydrochlorothiazide (PRINZIDE,ZESTORETIC) 20-25 MG per tablet Take 1 tablet by mouth daily.      Historical Provider, MD  meclizine (ANTIVERT) 25 MG tablet Take 25 mg by mouth 2 (two) times daily as needed for dizziness or nausea.     Historical Provider, MD  metFORMIN (GLUCOPHAGE) 1000 MG tablet Take 1,000 mg by mouth 2 (two) times daily with a meal.    Historical Provider, MD  metoprolol (LOPRESSOR) 50 MG tablet Take 50 mg by mouth 2 (two) times daily.      Historical Provider, MD  Multiple Vitamin (MULTIVITAMIN) tablet Take 1 tablet by mouth daily.  Historical Provider, MD  pioglitazone (ACTOS) 15 MG tablet Take 15 mg by mouth daily.    Historical Provider, MD  simvastatin (ZOCOR) 40 MG tablet Take 40 mg by mouth at bedtime.      Historical Provider, MD  sitaGLIPtan-metformin (JANUMET) 50-1000 MG per tablet Take 1 tablet by mouth 2 (two) times daily.     Historical Provider, MD   BP 139/86 mmHg  Pulse 83  Temp(Src) 97.7 F (36.5 C) (Oral)  Resp 20  Ht 4\' 11"  (1.499 m)  Wt 57.607 kg  BMI 25.64 kg/m2  SpO2 100% Physical Exam  Constitutional: She is oriented to person, place, and time. She appears well-developed and well-nourished. No distress.  HENT:  Head: Normocephalic and atraumatic.  Neck: Neck supple.  Cardiovascular: Normal rate, regular rhythm and normal heart sounds.  Exam reveals no  gallop and no friction rub.   No murmur heard. Intact and equal distal pulses.  Pulmonary/Chest: Effort normal. No respiratory distress.  Musculoskeletal: Normal range of motion.  5/5 muscle strength in bilateral LE TTP of MTP on right foot with mild swelling and erythema to the area. No drainage, no deformities. FROM  Lymphadenopathy:    She has no cervical adenopathy.  Neurological: She is alert and oriented to person, place, and time.  Bilateral lower extremities neurovascularly intact.   Skin: Skin is warm and dry.  Psychiatric: She has a normal mood and affect. Her behavior is normal.  Nursing note and vitals reviewed.   ED Course  Procedures (including critical care time)100  DIAGNOSTIC STUDIES: Oxygen Saturation is 100% on RA, normal by my interpretation.    COORDINATION OF CARE:  9:52 AM Discussed treatment plan with patient at bedside.  Patient acknowledges and agrees with plan.    Labs Review Labs Reviewed - No data to display  Imaging Review No results found. I have personally reviewed and evaluated these images and lab results as part of my medical decision-making.   EKG Interpretation None      MDM   Final diagnoses:  Acute gout of right foot, unspecified cause   [name] presents with acute onset of right big toe pain c/w gout. Will tx with indomethacin. Pt. Has good follow up and discussed PCP follow up to ensure resolution of symptoms, further evaluation. She expresses verbal understanding and agreement with plan.   Patient seen by and discussed with Dr. Wilson Singer who agrees with treatment plan.   I personally performed the services described in this documentation, which was scribed in my presence. The recorded information has been reviewed and is accurate.   [facility] Gregery Walberg, PA-C 11/22/15 [id number]  Virgel Manifold, MD 11/23/15 [phone]

## 2015-11-22 NOTE — ED Notes (Signed)
SEE PA assessment

## 2015-11-22 NOTE — Discharge Instructions (Signed)
1. Medications: Indomethacin - take as directed, continue usual home medications 2. Treatment: rest, drink plenty of fluids 3. Follow Up: Please follow up with your primary doctor in 3 days for discussion of your diagnoses and further evaluation after today's visit; Please return to the ER for fever, any new or worsening symptoms, any additional concerns.

## 2015-12-04 ENCOUNTER — Encounter: Payer: Self-pay | Admitting: Internal Medicine

## 2015-12-04 ENCOUNTER — Ambulatory Visit (INDEPENDENT_AMBULATORY_CARE_PROVIDER_SITE_OTHER): Payer: Medicare Other | Admitting: Internal Medicine

## 2015-12-04 VITALS — BP 138/80 | HR 84 | Ht 59.0 in | Wt 127.0 lb

## 2015-12-04 DIAGNOSIS — I10 Essential (primary) hypertension: Secondary | ICD-10-CM

## 2015-12-04 DIAGNOSIS — M1000 Idiopathic gout, unspecified site: Secondary | ICD-10-CM | POA: Diagnosis not present

## 2015-12-04 DIAGNOSIS — M109 Gout, unspecified: Secondary | ICD-10-CM | POA: Insufficient documentation

## 2015-12-04 DIAGNOSIS — I442 Atrioventricular block, complete: Secondary | ICD-10-CM | POA: Diagnosis not present

## 2015-12-04 DIAGNOSIS — Z950 Presence of cardiac pacemaker: Secondary | ICD-10-CM

## 2015-12-04 LAB — CUP PACEART INCLINIC DEVICE CHECK
Battery Remaining Longevity: 25 mo
Battery Voltage: 2.75 V
Date Time Interrogation Session: 20170113155007
Implantable Lead Implant Date: 20061024
Implantable Lead Location: 753859
Implantable Lead Model: 5076
Implantable Lead Model: 5076
Lead Channel Impedance Value: 611 Ohm
Lead Channel Pacing Threshold Amplitude: 1 V
Lead Channel Pacing Threshold Pulse Width: 0.4 ms
Lead Channel Sensing Intrinsic Amplitude: 2.8 mV
Lead Channel Setting Pacing Amplitude: 2 V
Lead Channel Setting Pacing Amplitude: 2.5 V
Lead Channel Setting Pacing Pulse Width: 0.4 ms
MDC IDC LEAD IMPLANT DT: 20061024
MDC IDC LEAD LOCATION: 753860
MDC IDC MSMT BATTERY IMPEDANCE: 2422 Ohm
MDC IDC MSMT LEADCHNL RA IMPEDANCE VALUE: 356 Ohm
MDC IDC MSMT LEADCHNL RA PACING THRESHOLD AMPLITUDE: 1 V
MDC IDC MSMT LEADCHNL RA PACING THRESHOLD PULSEWIDTH: 0.4 ms
MDC IDC SET LEADCHNL RV SENSING SENSITIVITY: 2.8 mV

## 2015-12-04 NOTE — Assessment & Plan Note (Signed)
Her Medtronic DDD PM is working normally. Will recheck in several months. 

## 2015-12-04 NOTE — Patient Instructions (Signed)
Medication Instructions:  Your physician recommends that you continue on your current medications as directed. Please refer to the Current Medication list given to you today.   Labwork: none  Testing/Procedures: none  Follow-Up: Your physician wants you to follow-up in: 12 months with Dr. Lovena Le. You will receive a reminder letter in the mail two months in advance. If you don't receive a letter, please call our office to schedule the follow-up appointment.  Your physician recommends that you schedule a follow-up appointment in: 3 months with Device Clinic.   Any Other Special Instructions Will Be Listed Below (If Applicable).     If you need a refill on your cardiac medications before your next appointment, please call your pharmacy.

## 2015-12-04 NOTE — Assessment & Plan Note (Signed)
She is instructed to lose weight and maintain a low sodium diet.

## 2015-12-04 NOTE — Assessment & Plan Note (Signed)
She had a gout attack involving her big toe. I have asked her to adjust her diet and have given her a list of foods to avoid.

## 2015-12-04 NOTE — Progress Notes (Signed)
HPI [name] returns today for followup. She is a 81 year old woman with a history of complete heart block, hypertension, and dyslipidemia, status post permanent pacemaker insertion. In the interim, she has been stable. She denies chest pain or shortness of breath. No syncope and no peripheral edema. In the interim, she has begun exercising. She has had problems with gout.  Allergies  Allergen Reactions  . Sulfa Drugs Cross Reactors     Swelling, blisters and itching     Current Outpatient Prescriptions  Medication Sig Dispense Refill  . aspirin 81 MG tablet Take 81 mg by mouth daily.      Marland Kitchen glipiZIDE (GLUCOTROL XL) 10 MG 24 hr tablet Take 10 mg by mouth 2 (two) times daily.      Marland Kitchen lisinopril-hydrochlorothiazide (PRINZIDE,ZESTORETIC) 20-25 MG per tablet Take 1 tablet by mouth daily.      . meclizine (ANTIVERT) 25 MG tablet Take 25 mg by mouth 2 (two) times daily as needed for dizziness or nausea.     . metoprolol (LOPRESSOR) 50 MG tablet Take 50 mg by mouth 2 (two) times daily.      . Multiple Vitamin (MULTIVITAMIN) tablet Take 1 tablet by mouth daily.      . pioglitazone (ACTOS) 15 MG tablet Take 15 mg by mouth daily.    . simvastatin (ZOCOR) 40 MG tablet Take 40 mg by mouth at bedtime.      . sitaGLIPtan-metformin (JANUMET) 50-1000 MG per tablet Take 1 tablet by mouth 2 (two) times daily.      No current facility-administered medications for this visit.     Past Medical History  Diagnosis Date  . DM (diabetes mellitus) ([place])   . Pacemaker   . HTN (hypertension)   . Atrioventricular block, complete ([place])   . Tubular adenoma of colon 03/2010  . Diverticulosis   . Hemorrhoids   . Hyperlipidemia   . Osteoporosis     ROS:   All systems reviewed and negative except as noted in the HPI.   Past Surgical History  Procedure Laterality Date  . Cardiac catheterization  09/12/05    Dr. Jenkins Rouge   . Pacemaker insertion  09/13/05    Medtronic, dual chamber. Dr. Lovena Le   .  Total abdominal hysterectomy w/ bilateral salpingoophorectomy    . Brain biopsy  1998    Benign     Family History  Problem Relation Age of Onset  . Diabetes Father   . Lung cancer Father      Social History   Social History  . Marital Status: Married    Spouse Name: N/A  . Number of Children: 1  . Years of Education: N/A   Occupational History  . Retired from [employer]  . Smoking status: Never Smoker   . Smokeless tobacco: Never Used     Comment: tobacco use - no  . Alcohol Use: No  . Drug Use: No  . Sexual Activity: Not on file   Other Topics Concern  . Not on file   Social History Narrative   Lives in [place] with her husband.      BP 138/80 mmHg  Pulse 84  Ht 4\' 11"  (1.499 m)  Wt 127 lb (57.607 kg)  BMI 25.64 kg/m2  Physical Exam:  Well appearing 81 year old woman, NAD HEENT: Unremarkable Neck:  6 cm JVD, no thyromegally Lungs:  Clear with no wheezes, rales, or rhonchi. HEART:  Regular rate rhythm, no murmurs, no  rubs, no clicks Abd:  soft, positive bowel sounds, no organomegally, no rebound, no guarding Ext:  2 plus pulses, no edema, no cyanosis, no clubbing Skin:  No rashes no nodules Neuro:  CN II through XII intact, motor grossly intact  DEVICE  Normal device function.  See PaceArt for details.   Assess/Plan:

## 2015-12-04 NOTE — Assessment & Plan Note (Signed)
Her blood pressure is reasonably well controlled. I have asked her to reduce her salt intake.

## 2016-03-03 ENCOUNTER — Ambulatory Visit (INDEPENDENT_AMBULATORY_CARE_PROVIDER_SITE_OTHER): Payer: Medicare Other | Admitting: *Deleted

## 2016-03-03 ENCOUNTER — Encounter: Payer: Self-pay | Admitting: Internal Medicine

## 2016-03-03 DIAGNOSIS — Z950 Presence of cardiac pacemaker: Secondary | ICD-10-CM | POA: Diagnosis not present

## 2016-03-03 NOTE — Progress Notes (Addendum)
Pacemaker check in clinic. Normal device function. Thresholds, sensing, impedances consistent with previous measurements. Device programmed to maximize longevity. One atrial high rte episode lasting 3sec. + ASA 81mg , no EGM. Device programmed at appropriate safety margins. Histogram distribution appropriate for patient activity level. Device programmed to optimize intrinsic conduction. Estimated longevity 7-68mo. [facility] 7/13 and ROV with GT 11/2016

## 2016-06-02 ENCOUNTER — Encounter: Payer: Self-pay | Admitting: Internal Medicine

## 2016-06-02 ENCOUNTER — Ambulatory Visit (INDEPENDENT_AMBULATORY_CARE_PROVIDER_SITE_OTHER): Payer: Medicare Other | Admitting: *Deleted

## 2016-06-02 DIAGNOSIS — Z950 Presence of cardiac pacemaker: Secondary | ICD-10-CM | POA: Diagnosis not present

## 2016-06-02 DIAGNOSIS — I442 Atrioventricular block, complete: Secondary | ICD-10-CM

## 2016-06-02 LAB — CUP PACEART INCLINIC DEVICE CHECK
Battery Impedance: 2766 Ohm
Battery Remaining Longevity: 20 mo
Battery Voltage: 2.73 V
Brady Statistic AP VS Percent: 0.1 %
Brady Statistic AS VP Percent: 93.8 %
Date Time Interrogation Session: 20170713112103
Implantable Lead Location: 753859
Implantable Lead Model: 5076
Implantable Lead Model: 5076
Lead Channel Impedance Value: 370 Ohm
Lead Channel Pacing Threshold Amplitude: 1 V
Lead Channel Pacing Threshold Amplitude: 1 V
Lead Channel Pacing Threshold Pulse Width: 0.4 ms
Lead Channel Pacing Threshold Pulse Width: 0.4 ms
Lead Channel Setting Pacing Amplitude: 2 V
Lead Channel Setting Pacing Pulse Width: 0.4 ms
Lead Channel Setting Sensing Sensitivity: 2.8 mV
MDC IDC LEAD IMPLANT DT: 20061024
MDC IDC LEAD IMPLANT DT: 20061024
MDC IDC LEAD LOCATION: 753860
MDC IDC MSMT LEADCHNL RA SENSING INTR AMPL: 2.8 mV
MDC IDC MSMT LEADCHNL RV IMPEDANCE VALUE: 568 Ohm
MDC IDC SET LEADCHNL RV PACING AMPLITUDE: 2.5 V
MDC IDC STAT BRADY AP VP PERCENT: 6.2 %
MDC IDC STAT BRADY AS VS PERCENT: 0.1 %

## 2016-06-02 NOTE — Progress Notes (Signed)
Pacemaker check in clinic. Normal device function. Thresholds, sensing, impedances consistent with previous measurements. Device programmed to maximize longevity. No mode switch or high ventricular rates noted. Device programmed at appropriate safety margins. Histogram distribution appropriate for patient activity level. Device programmed to optimize intrinsic conduction. Estimated longevity 20 months (<5-35 months). Patient education completed. ROV with device clinic in 3 months and with GT in 11/2016.

## 2016-06-24 ENCOUNTER — Encounter: Payer: Self-pay | Admitting: Gastroenterology

## 2016-08-05 ENCOUNTER — Ambulatory Visit: Payer: Medicare Other | Admitting: *Deleted

## 2016-08-05 ENCOUNTER — Encounter: Payer: Self-pay | Admitting: Gastroenterology

## 2016-08-05 VITALS — Ht 59.0 in | Wt 127.4 lb

## 2016-08-05 DIAGNOSIS — Z86010 Personal history of colonic polyps: Secondary | ICD-10-CM

## 2016-08-05 MED ORDER — SUPREP BOWEL PREP KIT 17.5-3.13-1.6 GM/177ML PO SOLN: 1.0000 | Freq: Once | ORAL | 0 refills | Status: AC

## 2016-08-05 NOTE — Progress Notes (Signed)
Patient denies any allergies to egg or soy products. Patient denies complications with anesthesia/sedation.  Patient denies oxygen use at home and denies diet medications. Emmi instructions for colonoscopy  explained but patient refused.  Pamphlet given.

## 2016-08-17 ENCOUNTER — Ambulatory Visit (AMBULATORY_SURGERY_CENTER): Payer: Medicare Other | Admitting: Gastroenterology

## 2016-08-17 ENCOUNTER — Encounter: Payer: Self-pay | Admitting: Gastroenterology

## 2016-08-17 VITALS — BP 134/60 | HR 61 | Temp 96.2°F | Resp 18 | Ht 59.0 in | Wt 127.0 lb

## 2016-08-17 DIAGNOSIS — Z86010 Personal history of colonic polyps: Secondary | ICD-10-CM

## 2016-08-17 DIAGNOSIS — D124 Benign neoplasm of descending colon: Secondary | ICD-10-CM

## 2016-08-17 LAB — GLUCOSE, CAPILLARY
GLUCOSE-CAPILLARY: 77 mg/dL (ref 65–99)
Glucose-Capillary: 103 mg/dL — ABNORMAL HIGH (ref 65–99)
Glucose-Capillary: 114 mg/dL — ABNORMAL HIGH (ref 65–99)

## 2016-08-17 MED ORDER — SODIUM CHLORIDE 0.9 % IV SOLN: 500.0000 mL | INTRAVENOUS | Status: AC

## 2016-08-17 NOTE — Progress Notes (Signed)
  [facility] Post-op Note  Patient: [name]  Procedure(s) Performed: colonoscopy  Patient Location: [facility] - Recovery Area  Anesthesia Type: Deep Sedation/Propofol  Level of Consciousness: awake, oriented and patient cooperative  Airway and Oxygen Therapy: Patient Spontanous Breathing  Post-op Pain: none  Post-op Assessment:  Post-op Vital signs reviewed, Patient's Cardiovascular Status Stable, Respiratory Function Stable, Patent Airway, No signs of Nausea or vomiting and Pain level controlled  Post-op Vital Signs: Reviewed and stable  Complications: No apparent anesthesia complications  Shadow Stiggers E 9:52 AM

## 2016-08-17 NOTE — Progress Notes (Signed)
Blood sugar rechecked with reading of 103 obtained.

## 2016-08-17 NOTE — Progress Notes (Signed)
Called to room to assist during endoscopic procedure.  Patient ID and intended procedure confirmed with present staff. Received instructions for my participation in the procedure from the performing physician.  

## 2016-08-17 NOTE — Patient Instructions (Signed)
YOU HAD AN ENDOSCOPIC PROCEDURE TODAY AT THE [facility]:   Refer to the procedure report that was given to you for any specific questions about what was found during the examination.  If the procedure report does not answer your questions, please call your gastroenterologist to clarify.  If you requested that your care partner not be given the details of your procedure findings, then the procedure report has been included in a sealed envelope for you to review at your convenience later.  YOU SHOULD EXPECT: Some feelings of bloating in the abdomen. Passage of more gas than usual.  Walking can help get rid of the air that was put into your GI tract during the procedure and reduce the bloating. If you had a lower endoscopy (such as a colonoscopy or flexible sigmoidoscopy) you may notice spotting of blood in your stool or on the toilet paper. If you underwent a bowel prep for your procedure, you may not have a normal bowel movement for a few days.  Please Note:  You might notice some irritation and congestion in your nose or some drainage.  This is from the oxygen used during your procedure.  There is no need for concern and it should clear up in a day or so.  SYMPTOMS TO REPORT IMMEDIATELY:   Following lower endoscopy (colonoscopy or flexible sigmoidoscopy):  Excessive amounts of blood in the stool  Significant tenderness or worsening of abdominal pains  Swelling of the abdomen that is new, acute  Fever of 100F or higher    For urgent or emergent issues, a gastroenterologist can be reached at any hour by calling [phone].   DIET:  We do recommend a small meal at first, but then you may proceed to your regular diet.  Drink plenty of fluids but you should avoid alcoholic beverages for 24 hours.  ACTIVITY:  You should plan to take it easy for the rest of today and you should NOT DRIVE or use heavy machinery until tomorrow (because of the sedation medicines used during the test).     FOLLOW UP: Our staff will call the number listed on your records the next business day following your procedure to check on you and address any questions or concerns that you may have regarding the information given to you following your procedure. If we do not reach you, we will leave a message.  However, if you are feeling well and you are not experiencing any problems, there is no need to return our call.  We will assume that you have returned to your regular daily activities without incident.  If any biopsies were taken you will be contacted by phone or by letter within the next 1-3 weeks.  Please call us at [phone] if you have not heard about the biopsies in 3 weeks.    SIGNATURES/CONFIDENTIALITY: You and/or your care partner have signed paperwork which will be entered into your electronic medical record.  These signatures attest to the fact that that the information above on your After Visit Summary has been reviewed and is understood.  Full responsibility of the confidentiality of this discharge information lies with you and/or your care-partner.   Resume medications. Information given on polyps,diverticulosis,hemorrhoids and high fiber diet. 

## 2016-08-17 NOTE — Op Note (Signed)
[facility] Patient Name: [name] Procedure Date: 08/17/2016 9:31 AM MRN: [id number] Endoscopist: Ladene Artist , MD Age: 81 Referring MD:  Date of Birth: [birth year] Gender: Female Account #: [id number] Procedure:                Colonoscopy Indications:              Surveillance: Personal history of adenomatous                            polyps on last colonoscopy > 3 years ago Medicines:                Monitored Anesthesia Care Procedure:                Pre-Anesthesia Assessment:                           - Prior to the procedure, a History and Physical                            was performed, and patient medications and                            allergies were reviewed. The patient's tolerance of                            previous anesthesia was also reviewed. The risks                            and benefits of the procedure and the sedation                            options and risks were discussed with the patient.                            All questions were answered, and informed consent                            was obtained. Prior Anticoagulants: The patient has                            taken no previous anticoagulant or antiplatelet                            agents. ASA Grade Assessment: II - A patient with                            mild systemic disease. After reviewing the risks                            and benefits, the patient was deemed in                            satisfactory condition to undergo the procedure.  After obtaining informed consent, the colonoscope                            was passed under direct vision. Throughout the                            procedure, the patient's blood pressure, pulse, and                            oxygen saturations were monitored continuously. The                            Model PCF-[id number]) scope was introduced                            through the anus  and advanced to the the cecum,                            identified by appendiceal orifice and ileocecal                            valve. The ileocecal valve, appendiceal orifice,                            and rectum were photographed. The quality of the                            bowel preparation was good. The colonoscopy was                            performed without difficulty. The patient tolerated                            the procedure well. Scope In: 9:36:04 AM Scope Out: 9:46:37 AM Scope Withdrawal Time: 0 hours 8 minutes 48 seconds  Total Procedure Duration: 0 hours 10 minutes 33 seconds  Findings:                 A 5 mm polyp was found in the descending colon. The                            polyp was sessile. The polyp was removed with a                            cold biopsy forceps. Resection and retrieval were                            complete.                           Internal hemorrhoids were found during                            retroflexion. The hemorrhoids were small and Grade  I (internal hemorrhoids that do not prolapse).                           The perianal and digital rectal examinations were                            normal.                           A few small-mouthed diverticula were found in the                            transverse colon and ascending colon. There was no                            evidence of diverticular bleeding.                           The exam was otherwise without abnormality on                            direct and retroflexion views. Complications:            No immediate complications. Estimated blood loss:                            None. Estimated Blood Loss:     Estimated blood loss: none. Impression:               - One 5 mm polyp in the descending colon, removed                            with a cold biopsy forceps. Resected and retrieved.                           - Internal  hemorrhoids.                           - Mild diverticulosis in the transverse colon and                            in the ascending colon.                           - The examination was otherwise normal on direct                            and retroflexion views. Recommendation:           - Patient has a contact number available for                            emergencies. The signs and symptoms of potential                            delayed complications were discussed with the  patient. Return to normal activities tomorrow.                            Written discharge instructions were provided to the                            patient.                           - Resume previous diet.                           - Continue present medications.                           - Await pathology results.                           - No repeat colonoscopy due to age. Ladene Artist, MD 08/17/2016 9:51:42 AM This report has been signed electronically.

## 2016-08-18 ENCOUNTER — Telehealth: Payer: Self-pay | Admitting: *Deleted

## 2016-08-18 NOTE — Telephone Encounter (Signed)
  Follow up Call-  Call back number 08/17/2016  Post procedure Call Back phone  # [phone]  Permission to leave phone message Yes  Some recent data might be hidden     Patient questions:  Do you have a fever, pain , or abdominal swelling? No. Pain Score  0 *  Have you tolerated food without any problems? Yes.    Have you been able to return to your normal activities? Yes.    Do you have any questions about your discharge instructions: Diet   No. Medications  No. Follow up visit  No.  Do you have questions or concerns about your Care? No.  Actions: * If pain score is 4 or above: No action needed, pain <4.

## 2016-08-31 ENCOUNTER — Encounter: Payer: Self-pay | Admitting: Gastroenterology

## 2016-09-01 ENCOUNTER — Ambulatory Visit (INDEPENDENT_AMBULATORY_CARE_PROVIDER_SITE_OTHER): Payer: Medicare Other | Admitting: *Deleted

## 2016-09-01 DIAGNOSIS — I442 Atrioventricular block, complete: Secondary | ICD-10-CM

## 2016-09-01 DIAGNOSIS — Z950 Presence of cardiac pacemaker: Secondary | ICD-10-CM | POA: Diagnosis not present

## 2016-09-01 LAB — CUP PACEART INCLINIC DEVICE CHECK
Battery Impedance: 3037 Ohm
Brady Statistic AP VP Percent: 8.3 %
Brady Statistic AS VP Percent: 91.7 %
Date Time Interrogation Session: 20171012112420
Implantable Lead Implant Date: 20061024
Implantable Lead Location: 753859
Implantable Lead Model: 5076
Implantable Lead Model: 5076
Lead Channel Pacing Threshold Amplitude: 1 V
Lead Channel Pacing Threshold Amplitude: 1 V
Lead Channel Pacing Threshold Pulse Width: 0.4 ms
Lead Channel Pacing Threshold Pulse Width: 0.4 ms
Lead Channel Sensing Intrinsic Amplitude: 2.8 mV
MDC IDC LEAD IMPLANT DT: 20061024
MDC IDC LEAD LOCATION: 753860
MDC IDC MSMT BATTERY REMAINING LONGEVITY: 17 mo
MDC IDC MSMT BATTERY VOLTAGE: 2.74 V
MDC IDC MSMT LEADCHNL RA IMPEDANCE VALUE: 391 Ohm
MDC IDC MSMT LEADCHNL RV IMPEDANCE VALUE: 566 Ohm
MDC IDC SET LEADCHNL RA PACING AMPLITUDE: 2 V
MDC IDC SET LEADCHNL RV PACING AMPLITUDE: 2.5 V
MDC IDC SET LEADCHNL RV PACING PULSEWIDTH: 0.4 ms
MDC IDC SET LEADCHNL RV SENSING SENSITIVITY: 2.8 mV
MDC IDC STAT BRADY AP VS PERCENT: 0.1 %
MDC IDC STAT BRADY AS VS PERCENT: 0.1 %

## 2016-09-01 NOTE — Progress Notes (Signed)
Pacemaker check in clinic. Normal device function. Thresholds, sensing, impedances consistent with previous measurements. Device programmed to maximize longevity. No mode switch or high ventricular rates noted. Device programmed at appropriate safety margins. Histogram distribution appropriate for patient activity level. Device programmed to optimize intrinsic conduction. Estimated longevity 17 months (<3-32 months). Patient education completed. ROV with GT on 12/06/16.

## 2016-09-14 ENCOUNTER — Other Ambulatory Visit: Payer: Self-pay | Admitting: Internal Medicine

## 2016-09-14 DIAGNOSIS — Z1231 Encounter for screening mammogram for malignant neoplasm of breast: Secondary | ICD-10-CM

## 2016-09-29 ENCOUNTER — Ambulatory Visit
Admission: RE | Admit: 2016-09-29 | Discharge: 2016-09-29 | Disposition: A | Discharge status: Home / Self Care | Payer: Medicare Other | Source: Ambulatory Visit | Attending: Internal Medicine | Admitting: Internal Medicine

## 2016-09-29 DIAGNOSIS — Z1231 Encounter for screening mammogram for malignant neoplasm of breast: Secondary | ICD-10-CM

## 2016-12-06 ENCOUNTER — Encounter: Payer: Self-pay | Admitting: Internal Medicine

## 2016-12-06 ENCOUNTER — Ambulatory Visit (INDEPENDENT_AMBULATORY_CARE_PROVIDER_SITE_OTHER): Payer: Medicare Other | Admitting: Internal Medicine

## 2016-12-06 VITALS — BP 146/84 | HR 67 | Ht 59.0 in | Wt 128.2 lb

## 2016-12-06 DIAGNOSIS — Z45018 Encounter for adjustment and management of other part of cardiac pacemaker: Secondary | ICD-10-CM

## 2016-12-06 DIAGNOSIS — I442 Atrioventricular block, complete: Secondary | ICD-10-CM

## 2016-12-06 NOTE — Patient Instructions (Signed)
Medication Instructions:    Your physician recommends that you continue on your current medications as directed. Please refer to the Current Medication list given to you today.  --- If you need a refill on your cardiac medications before your next appointment, please call your pharmacy. ---  Labwork:  None ordered  Testing/Procedures:  None ordered  Follow-Up:  Your physician wants you to follow-up in: 6 months with device clinic.  You will receive a reminder letter in the mail two months in advance. If you don't receive a letter, please call our office to schedule the follow-up appointment.    Your physician wants you to follow-up in: 1 year with Dr. Taylor.  You will receive a reminder letter in the mail two months in advance. If you don't receive a letter, please call our office to schedule the follow-up appointment.  Thank you for choosing [facility]!!            

## 2016-12-06 NOTE — Progress Notes (Signed)
HPI Mrs. Brands returns today for followup. She is a 82 year old woman with a history of complete heart block, hypertension, and dyslipidemia, status post permanent pacemaker insertion. In the interim, she has been stable. She denies chest pain or shortness of breath. No syncope and no peripheral edema. In the interim, she has begun exercising. She has had problems with gout but mostly this has been controlled. Allergies  Allergen Reactions  . Sulfa Drugs Cross Reactors     Swelling, blisters and itching     Current Outpatient Prescriptions  Medication Sig Dispense Refill  . aspirin 81 MG tablet Take 81 mg by mouth daily.      Marland Kitchen glipiZIDE (GLUCOTROL XL) 10 MG 24 hr tablet Take 10 mg by mouth 2 (two) times daily.      Marland Kitchen lisinopril-hydrochlorothiazide (PRINZIDE,ZESTORETIC) 20-25 MG per tablet Take 1 tablet by mouth daily.      . meclizine (ANTIVERT) 25 MG tablet Take 25 mg by mouth 2 (two) times daily as needed for dizziness or nausea.     . metoprolol (LOPRESSOR) 50 MG tablet Take 50 mg by mouth 2 (two) times daily.      . Multiple Vitamin (MULTIVITAMIN) tablet Take 1 tablet by mouth daily.      . pioglitazone (ACTOS) 15 MG tablet Take 15 mg by mouth daily.    . simvastatin (ZOCOR) 40 MG tablet Take 40 mg by mouth every morning.     . sitaGLIPtan-metformin (JANUMET) 50-1000 MG per tablet Take 1 tablet by mouth 2 (two) times daily.      Current Facility-Administered Medications  Medication Dose Route Frequency Provider Last Rate Last Dose  . 0.9 %  sodium chloride infusion  500 mL Intravenous Continuous Ladene Artist, MD         Past Medical History:  Diagnosis Date  . Atrioventricular block, complete ([place])   . Diverticulosis   . DM (diabetes mellitus) ([place])   . Hemorrhoids   . HTN (hypertension)   . Hyperlipidemia   . Osteoporosis   . Pacemaker   . Tubular adenoma of colon 03/2010    ROS:   All systems reviewed and negative except as noted in the HPI.   Past Surgical  History:  Procedure Laterality Date  . BRAIN BIOPSY  1998   Benign  . CARDIAC CATHETERIZATION  09/12/05   Dr. Jenkins [place]   . COLONOSCOPY  2013   [facility] -polyps  . PACEMAKER INSERTION  09/13/05   Medtronic, dual chamber. Dr. Lovena Le   . TOTAL ABDOMINAL HYSTERECTOMY W/ BILATERAL SALPINGOOPHORECTOMY    . WISDOM TOOTH EXTRACTION       Family History  Problem Relation Age of Onset  . Diabetes Father   . Lung cancer Father   . Colon cancer Neg Hx   . Colon polyps Neg Hx   . Esophageal cancer Neg Hx   . Rectal cancer Neg Hx   . Stomach cancer Neg Hx      Social History   Social History  . Marital status: Married    Spouse name: N/A  . Number of children: 1  . Years of education: N/A   Occupational History  . Retired from [employer] Retired   Social History Main Topics  . Smoking status: Never Smoker  . Smokeless tobacco: Never Used     Comment: tobacco use - no  . Alcohol use No  . Drug use: No  . Sexual activity: Not on file     Comment: Hysterectomy  Other Topics Concern  . Not on file   Social History Narrative   Lives in [place] with her husband.      BP (!) 146/84   Pulse 67   Ht 4\' 11"  (1.499 m)   Wt 128 lb 3.2 oz (58.2 kg)   BMI 25.89 kg/m   Physical Exam:  Well appearing 82 year old woman, NAD HEENT: Unremarkable Neck:  6 cm JVD, no thyromegally Lungs:  Clear with no wheezes, rales, or rhonchi. HEART:  Regular rate rhythm, no murmurs, no rubs, no clicks Abd:  soft, positive bowel sounds, no organomegally, no rebound, no guarding Ext:  2 plus pulses, no edema, no cyanosis, no clubbing Skin:  No rashes no nodules Neuro:  CN II through XII intact, motor grossly intact  DEVICE  Normal device function.  See PaceArt for details.   Assess/Plan:  1. PPM - her Medtronic DDD PM is working normally. Will recheck in several months. 2. CHB - she has no escape at 30/min today. She is asymptomatic with her PPM 3. HTN heart disease - her blood pressure is  up a bit as it often is in the office. At home better. 4. Gout - she has been asked to adjust her diet. She is reasonably well controlled on dietary therapy.  Mikle Bosworth.D.

## 2017-01-02 LAB — CUP PACEART INCLINIC DEVICE CHECK
Battery Voltage: 2.71 V
Brady Statistic RA Percent Paced: 5 %
Implantable Lead Implant Date: 20061024
Implantable Lead Location: 753859
Implantable Lead Model: 5076
Implantable Pulse Generator Implant Date: 20061024
Lead Channel Impedance Value: 577 Ohm
Lead Channel Pacing Threshold Amplitude: 1 V
Lead Channel Pacing Threshold Amplitude: 1 V
Lead Channel Pacing Threshold Pulse Width: 0.4 ms
Lead Channel Sensing Intrinsic Amplitude: 2 mV
Lead Channel Setting Pacing Amplitude: 2 V
Lead Channel Setting Pacing Amplitude: 2.5 V
Lead Channel Setting Sensing Sensitivity: 2.8 mV
MDC IDC LEAD IMPLANT DT: 20061024
MDC IDC LEAD LOCATION: 753860
MDC IDC MSMT LEADCHNL RA IMPEDANCE VALUE: 370 Ohm
MDC IDC MSMT LEADCHNL RV PACING THRESHOLD PULSEWIDTH: 0.4 ms
MDC IDC SESS DTM: 20180212153657
MDC IDC SET LEADCHNL RV PACING PULSEWIDTH: 0.4 ms
MDC IDC STAT BRADY RV PERCENT PACED: 100 %

## 2017-06-07 ENCOUNTER — Ambulatory Visit (INDEPENDENT_AMBULATORY_CARE_PROVIDER_SITE_OTHER): Payer: Medicare Other | Admitting: *Deleted

## 2017-06-07 DIAGNOSIS — I442 Atrioventricular block, complete: Secondary | ICD-10-CM

## 2017-06-07 DIAGNOSIS — Z950 Presence of cardiac pacemaker: Secondary | ICD-10-CM | POA: Diagnosis not present

## 2017-06-07 LAB — CUP PACEART INCLINIC DEVICE CHECK
Battery Remaining Longevity: 10 mo
Battery Voltage: 2.68 V
Date Time Interrogation Session: 20180718121205
Implantable Lead Implant Date: 20061024
Implantable Lead Implant Date: 20061024
Implantable Lead Location: 753860
Implantable Lead Model: 5076
Lead Channel Pacing Threshold Pulse Width: 0.4 ms
Lead Channel Pacing Threshold Pulse Width: 0.4 ms
Lead Channel Sensing Intrinsic Amplitude: 2.8 mV
Lead Channel Setting Pacing Amplitude: 2 V
Lead Channel Setting Pacing Amplitude: 2.5 V
Lead Channel Setting Pacing Pulse Width: 0.4 ms
Lead Channel Setting Sensing Sensitivity: 2.8 mV
MDC IDC LEAD LOCATION: 753859
MDC IDC MSMT BATTERY IMPEDANCE: 4516 Ohm
MDC IDC MSMT LEADCHNL RA IMPEDANCE VALUE: 370 Ohm
MDC IDC MSMT LEADCHNL RA PACING THRESHOLD AMPLITUDE: 1 V
MDC IDC MSMT LEADCHNL RV IMPEDANCE VALUE: 557 Ohm
MDC IDC MSMT LEADCHNL RV PACING THRESHOLD AMPLITUDE: 1 V
MDC IDC PG IMPLANT DT: 20061024

## 2017-06-07 NOTE — Progress Notes (Signed)
Pacemaker check in clinic. Normal device function. Thresholds, sensing, impedances consistent with previous measurements. Device programmed to maximize longevity. No mode switch or high ventricular rates noted. Device programmed at appropriate safety margins. Histogram distribution appropriate for patient activity level. Device programmed to optimize intrinsic conduction. Estimated longevity 10 months (<1-20 months). Patient education completed. ROV with Device Clinic on 09/07/17 for battery check and ROV with GT in 11/2017.

## 2017-09-07 ENCOUNTER — Ambulatory Visit (INDEPENDENT_AMBULATORY_CARE_PROVIDER_SITE_OTHER): Payer: Medicare Other | Admitting: *Deleted

## 2017-09-07 ENCOUNTER — Encounter (INDEPENDENT_AMBULATORY_CARE_PROVIDER_SITE_OTHER): Payer: Self-pay

## 2017-09-07 DIAGNOSIS — I442 Atrioventricular block, complete: Secondary | ICD-10-CM | POA: Diagnosis not present

## 2017-09-07 LAB — CUP PACEART INCLINIC DEVICE CHECK
Battery Remaining Longevity: 3 mo
Battery Voltage: 2.64 V
Brady Statistic AP VP Percent: 4.1 %
Brady Statistic AS VS Percent: 0.1 % — CL
Implantable Lead Implant Date: 20061024
Implantable Lead Location: 753859
Implantable Lead Model: 5076
Implantable Lead Model: 5076
Implantable Pulse Generator Implant Date: 20061024
Lead Channel Impedance Value: 588 Ohm
Lead Channel Pacing Threshold Amplitude: 1 V
Lead Channel Pacing Threshold Amplitude: 1 V
Lead Channel Pacing Threshold Pulse Width: 0.4 ms
Lead Channel Sensing Intrinsic Amplitude: 2.8 mV
MDC IDC LEAD IMPLANT DT: 20061024
MDC IDC LEAD LOCATION: 753860
MDC IDC MSMT LEADCHNL RA IMPEDANCE VALUE: 400 Ohm
MDC IDC SESS DTM: 20181018112143
MDC IDC STAT BRADY AP VS PERCENT: 0 %
MDC IDC STAT BRADY AS VP PERCENT: 95.9 %

## 2017-09-07 NOTE — Progress Notes (Signed)
Pacemaker check in clinic. Normal device function. Thresholds, sensing, impedances consistent with previous measurements. Device programmed to maximize longevity. No mode switch or high ventricular rates noted. Device programmed at appropriate safety margins. Histogram distribution appropriate for patient activity level. Device programmed to optimize intrinsic conduction. Estimated longevity 3 months (<1-10 months). Patient education completed. ROV with Device Clinic 10/09/17 for battery check only (N/C), ROV with GT 11/2017

## 2017-09-19 ENCOUNTER — Other Ambulatory Visit: Payer: Self-pay | Admitting: Internal Medicine

## 2017-09-19 DIAGNOSIS — Z1231 Encounter for screening mammogram for malignant neoplasm of breast: Secondary | ICD-10-CM

## 2017-10-09 ENCOUNTER — Ambulatory Visit (INDEPENDENT_AMBULATORY_CARE_PROVIDER_SITE_OTHER): Payer: Self-pay | Admitting: *Deleted

## 2017-10-09 ENCOUNTER — Encounter (INDEPENDENT_AMBULATORY_CARE_PROVIDER_SITE_OTHER): Payer: Self-pay

## 2017-10-09 DIAGNOSIS — Z950 Presence of cardiac pacemaker: Secondary | ICD-10-CM

## 2017-10-09 DIAGNOSIS — I442 Atrioventricular block, complete: Secondary | ICD-10-CM

## 2017-10-09 LAB — CUP PACEART INCLINIC DEVICE CHECK
Date Time Interrogation Session: 20181119101223
Implantable Lead Implant Date: 20061024
Implantable Lead Location: 753859
Implantable Lead Location: 753860
MDC IDC LEAD IMPLANT DT: 20061024
MDC IDC PG IMPLANT DT: 20061024

## 2017-10-09 NOTE — Progress Notes (Signed)
PPM battery check only. <1 month- 6 months battery longevity. Battery voltage 2.61V, impedance 7358 ohms. Device Clinic battery check 11/06/17, ROV with GT 12/19/17.

## 2017-10-11 ENCOUNTER — Ambulatory Visit
Admission: RE | Admit: 2017-10-11 | Discharge: 2017-10-11 | Disposition: A | Discharge status: Home / Self Care | Payer: Medicare Other | Source: Ambulatory Visit | Attending: Internal Medicine | Admitting: Internal Medicine

## 2017-10-11 DIAGNOSIS — Z1231 Encounter for screening mammogram for malignant neoplasm of breast: Secondary | ICD-10-CM

## 2017-11-06 ENCOUNTER — Ambulatory Visit (INDEPENDENT_AMBULATORY_CARE_PROVIDER_SITE_OTHER): Payer: Medicare Other | Admitting: *Deleted

## 2017-11-06 DIAGNOSIS — Z950 Presence of cardiac pacemaker: Secondary | ICD-10-CM

## 2017-11-06 DIAGNOSIS — I442 Atrioventricular block, complete: Secondary | ICD-10-CM | POA: Diagnosis not present

## 2017-11-06 LAB — CUP PACEART INCLINIC DEVICE CHECK
Battery Voltage: 2.59 V
Date Time Interrogation Session: 20181217112555
Implantable Lead Implant Date: 20061024
Implantable Lead Location: 753859
Implantable Lead Location: 753860
Implantable Pulse Generator Implant Date: 20061024
Lead Channel Impedance Value: 558 Ohm
Lead Channel Pacing Threshold Amplitude: 1 V
Lead Channel Pacing Threshold Pulse Width: 0.4 ms
Lead Channel Setting Pacing Pulse Width: 0.4 ms
MDC IDC LEAD IMPLANT DT: 20061024
MDC IDC MSMT BATTERY IMPEDANCE: 8747 Ohm
MDC IDC MSMT LEADCHNL RA IMPEDANCE VALUE: 0 Ohm
MDC IDC SET LEADCHNL RV PACING AMPLITUDE: 2.5 V
MDC IDC SET LEADCHNL RV SENSING SENSITIVITY: 2.8 mV
MDC IDC STAT BRADY RV PERCENT PACED: 100 %

## 2017-11-06 NOTE — Progress Notes (Signed)
Pacemaker check in clinic. Normal device function, ERI as of 11/02/17. Thresholds, sensing, impedances consistent with previous measurements. Device programmed to maximize longevity. No high ventricular rates noted. Device programmed at appropriate safety margins. Histogram distribution appropriate for patient activity level. Patient education completed. ROV with GT on 11/13/17 to discuss generator change.

## 2017-11-13 ENCOUNTER — Encounter: Payer: Self-pay | Admitting: Internal Medicine

## 2017-11-13 ENCOUNTER — Encounter (INDEPENDENT_AMBULATORY_CARE_PROVIDER_SITE_OTHER): Payer: Self-pay

## 2017-11-13 ENCOUNTER — Ambulatory Visit: Payer: Medicare Other | Admitting: Internal Medicine

## 2017-11-13 VITALS — BP 126/80 | HR 65 | Ht 59.0 in | Wt 127.6 lb

## 2017-11-13 DIAGNOSIS — Z01812 Encounter for preprocedural laboratory examination: Secondary | ICD-10-CM | POA: Diagnosis not present

## 2017-11-13 DIAGNOSIS — Z950 Presence of cardiac pacemaker: Secondary | ICD-10-CM

## 2017-11-13 DIAGNOSIS — I442 Atrioventricular block, complete: Secondary | ICD-10-CM

## 2017-11-13 DIAGNOSIS — I10 Essential (primary) hypertension: Secondary | ICD-10-CM

## 2017-11-13 LAB — CBC WITH DIFFERENTIAL/PLATELET
BASOS: 0 %
Basophils Absolute: 0 10*3/uL (ref 0.0–0.2)
EOS (ABSOLUTE): 0.2 10*3/uL (ref 0.0–0.4)
EOS: 2 %
Hematocrit: 33.4 % — ABNORMAL LOW (ref 34.0–46.6)
Hemoglobin: 10.9 g/dL — ABNORMAL LOW (ref 11.1–15.9)
IMMATURE GRANS (ABS): 0 10*3/uL (ref 0.0–0.1)
IMMATURE GRANULOCYTES: 0 %
LYMPHS: 26 %
Lymphocytes Absolute: 2.7 10*3/uL (ref 0.7–3.1)
MCH: 30.4 pg (ref 26.6–33.0)
MCHC: 32.6 g/dL (ref 31.5–35.7)
MCV: 93 fL (ref 79–97)
Monocytes Absolute: 0.6 10*3/uL (ref 0.1–0.9)
Monocytes: 5 %
NEUTROS PCT: 67 %
Neutrophils Absolute: 6.8 10*3/uL (ref 1.4–7.0)
PLATELETS: 297 10*3/uL (ref 150–379)
RBC: 3.59 x10E6/uL — ABNORMAL LOW (ref 3.77–5.28)
RDW: 14.3 % (ref 12.3–15.4)
WBC: 10.3 10*3/uL (ref 3.4–10.8)

## 2017-11-13 LAB — CUP PACEART INCLINIC DEVICE CHECK
Implantable Lead Implant Date: 20061024
Implantable Lead Location: 753860
Implantable Lead Model: 5076
Implantable Lead Model: 5076
Implantable Pulse Generator Implant Date: 20061024
MDC IDC LEAD IMPLANT DT: 20061024
MDC IDC LEAD LOCATION: 753859
MDC IDC SESS DTM: 20181224093144

## 2017-11-13 LAB — BASIC METABOLIC PANEL
BUN/Creatinine Ratio: 16 (ref 12–28)
BUN: 31 mg/dL — ABNORMAL HIGH (ref 8–27)
CALCIUM: 10.1 mg/dL (ref 8.7–10.3)
CO2: 22 mmol/L (ref 20–29)
CREATININE: 1.89 mg/dL — AB (ref 0.57–1.00)
Chloride: 105 mmol/L (ref 96–106)
GFR calc Af Amer: 28 mL/min/{1.73_m2} — ABNORMAL LOW (ref >59)
GFR, EST NON AFRICAN AMERICAN: 25 mL/min/{1.73_m2} — AB (ref >59)
Glucose: 191 mg/dL — ABNORMAL HIGH (ref 65–99)
POTASSIUM: 4.1 mmol/L (ref 3.5–5.2)
Sodium: 142 mmol/L (ref 134–144)

## 2017-11-13 NOTE — Progress Notes (Signed)
HPI Mrs. Lupu returns today for followup of her PPM placed for CHB. She also has a h/o HTN, dyslipidemia, and she notes that in the last few weeks (ERI), she has felt poorly with peripheral edema.  Allergies  Allergen Reactions  . Sulfa Drugs Cross Reactors     Swelling, blisters and itching     Current Outpatient Medications  Medication Sig Dispense Refill  . aspirin 81 MG tablet Take 81 mg by mouth daily.      Marland Kitchen glipiZIDE (GLUCOTROL XL) 10 MG 24 hr tablet Take 10 mg by mouth 2 (two) times daily.      Marland Kitchen lisinopril-hydrochlorothiazide (PRINZIDE,ZESTORETIC) 20-25 MG per tablet Take 1 tablet by mouth daily.      . meclizine (ANTIVERT) 25 MG tablet Take 25 mg by mouth 2 (two) times daily as needed for dizziness or nausea.     . metoprolol (LOPRESSOR) 50 MG tablet Take 50 mg by mouth 2 (two) times daily.      . Multiple Vitamin (MULTIVITAMIN) tablet Take 1 tablet by mouth daily.      . pioglitazone (ACTOS) 15 MG tablet Take 15 mg by mouth daily.    . simvastatin (ZOCOR) 40 MG tablet Take 40 mg by mouth every morning.     . sitaGLIPtan-metformin (JANUMET) 50-1000 MG per tablet Take 1 tablet by mouth 2 (two) times daily.      Current Facility-Administered Medications  Medication Dose Route Frequency Provider Last Rate Last Dose  . 0.9 %  sodium chloride infusion  500 mL Intravenous Continuous Ladene Artist, MD         Past Medical History:  Diagnosis Date  . Atrioventricular block, complete ([place])   . Diverticulosis   . DM (diabetes mellitus) ([place])   . Hemorrhoids   . HTN (hypertension)   . Hyperlipidemia   . Osteoporosis   . Pacemaker   . Tubular adenoma of colon 03/2010    ROS:   All systems reviewed and negative except as noted in the HPI.   Past Surgical History:  Procedure Laterality Date  . BRAIN BIOPSY  1998   Benign  . CARDIAC CATHETERIZATION  09/12/05   Dr. Jenkins Rouge   . COLONOSCOPY  2013   [facility] -polyps  . PACEMAKER INSERTION  09/13/05   Medtronic, dual chamber. Dr. Lovena Le   . TOTAL ABDOMINAL HYSTERECTOMY W/ BILATERAL SALPINGOOPHORECTOMY    . WISDOM TOOTH EXTRACTION       Family History  Problem Relation Age of Onset  . Diabetes Father   . Lung cancer Father   . Colon cancer Neg Hx   . Colon polyps Neg Hx   . Esophageal cancer Neg Hx   . Rectal cancer Neg Hx   . Stomach cancer Neg Hx      Social History   Socioeconomic History  . Marital status: Married    Spouse name: Not on file  . Number of children: 1  . Years of education: Not on file  . Highest education level: Not on file  Social Needs  . Financial resource strain: Not on file  . Food insecurity - worry: Not on file  . Food insecurity - inability: Not on file  . Transportation needs - medical: Not on file  . Transportation needs - non-medical: Not on file  Occupational History  . Occupation: Retired from [employer] store    Employer: RETIRED  Tobacco Use  . Smoking status: Never Smoker  . Smokeless tobacco: Never  Used  . Tobacco comment: tobacco use - no  Substance and Sexual Activity  . Alcohol use: No  . Drug use: No  . Sexual activity: Not on file    Comment: Hysterectomy  Other Topics Concern  . Not on file  Social History Narrative   Lives in [place] with her husband.      BP 126/80   Pulse 65   Ht 4\' 11"  (1.499 m)   Wt 127 lb 9.6 oz (57.9 kg)   BMI 25.77 kg/m   Physical Exam:  Well appearing 82 yo woman, NAD HEENT: Unremarkable Neck:  6 cm JVD, no thyromegally Lymphatics:  No adenopathy Back:  No CVA tenderness Lungs:  Clear with no wheezes HEART:  Regular rate rhythm, no murmurs, no rubs, no clicks Abd:  soft, positive bowel sounds, no organomegally, no rebound, no guarding Ext:  2 plus pulses, no edema, no cyanosis, no clubbing Skin:  No rashes no nodules Neuro:  CN II through XII intact, motor grossly intact  EKG - NSR with ventricular pacing, dysynchronous  DEVICE  Normal device function.  See PaceArt for details.  Device is at [facility].  Assess/Plan: 1. CHB - she is s/p PPM. She did well until reaching ERI. 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 2. PM - her medtronic DDD PM is working normally. Will recheck in several months. She will undergo gen change in a week or two.  Mikle Bosworth.D.

## 2017-11-13 NOTE — H&P (View-Only) (Signed)
HPI [name] returns today for followup of her PPM placed for CHB. She also has a h/o HTN, dyslipidemia, and she notes that in the last few weeks (ERI), she has felt poorly with peripheral edema.  Allergies  Allergen Reactions  . Sulfa Drugs Cross Reactors     Swelling, blisters and itching     Current Outpatient Medications  Medication Sig Dispense Refill  . aspirin 81 MG tablet Take 81 mg by mouth daily.      Marland Kitchen glipiZIDE (GLUCOTROL XL) 10 MG 24 hr tablet Take 10 mg by mouth 2 (two) times daily.      Marland Kitchen lisinopril-hydrochlorothiazide (PRINZIDE,ZESTORETIC) 20-25 MG per tablet Take 1 tablet by mouth daily.      . meclizine (ANTIVERT) 25 MG tablet Take 25 mg by mouth 2 (two) times daily as needed for dizziness or nausea.     . metoprolol (LOPRESSOR) 50 MG tablet Take 50 mg by mouth 2 (two) times daily.      . Multiple Vitamin (MULTIVITAMIN) tablet Take 1 tablet by mouth daily.      . pioglitazone (ACTOS) 15 MG tablet Take 15 mg by mouth daily.    . simvastatin (ZOCOR) 40 MG tablet Take 40 mg by mouth every morning.     . sitaGLIPtan-metformin (JANUMET) 50-1000 MG per tablet Take 1 tablet by mouth 2 (two) times daily.      Current Facility-Administered Medications  Medication Dose Route Frequency Provider Last Rate Last Dose  . 0.9 %  sodium chloride infusion  500 mL Intravenous Continuous Ladene Artist, MD         Past Medical History:  Diagnosis Date  . Atrioventricular block, complete ([place])   . Diverticulosis   . DM (diabetes mellitus) ([place])   . Hemorrhoids   . HTN (hypertension)   . Hyperlipidemia   . Osteoporosis   . Pacemaker   . Tubular adenoma of colon 03/2010    ROS:   All systems reviewed and negative except as noted in the HPI.   Past Surgical History:  Procedure Laterality Date  . BRAIN BIOPSY  1998   Benign  . CARDIAC CATHETERIZATION  09/12/05   Dr. Jenkins Rouge   . COLONOSCOPY  2013   [facility] -polyps  . PACEMAKER INSERTION  09/13/05   Medtronic, dual chamber. Dr. Lovena Le   . TOTAL ABDOMINAL HYSTERECTOMY W/ BILATERAL SALPINGOOPHORECTOMY    . WISDOM TOOTH EXTRACTION       Family History  Problem Relation Age of Onset  . Diabetes Father   . Lung cancer Father   . Colon cancer Neg Hx   . Colon polyps Neg Hx   . Esophageal cancer Neg Hx   . Rectal cancer Neg Hx   . Stomach cancer Neg Hx      Social History   Socioeconomic History  . Marital status: Married    Spouse name: Not on file  . Number of children: 1  . Years of education: Not on file  . Highest education level: Not on file  Social Needs  . Financial resource strain: Not on file  . Food insecurity - worry: Not on file  . Food insecurity - inability: Not on file  . Transportation needs - medical: Not on file  . Transportation needs - non-medical: Not on file  Occupational History  . Occupation: Retired from [employer] store    Employer: RETIRED  Tobacco Use  . Smoking status: Never Smoker  . Smokeless tobacco: Never  Used  . Tobacco comment: tobacco use - no  Substance and Sexual Activity  . Alcohol use: No  . Drug use: No  . Sexual activity: Not on file    Comment: Hysterectomy  Other Topics Concern  . Not on file  Social History Narrative   Lives in [place] with her husband.      BP 126/80   Pulse 65   Ht 4\' 11"  (1.499 m)   Wt 127 lb 9.6 oz (57.9 kg)   BMI 25.77 kg/m   Physical Exam:  Well appearing 82 yo woman, NAD HEENT: Unremarkable Neck:  6 cm JVD, no thyromegally Lymphatics:  No adenopathy Back:  No CVA tenderness Lungs:  Clear with no wheezes HEART:  Regular rate rhythm, no murmurs, no rubs, no clicks Abd:  soft, positive bowel sounds, no organomegally, no rebound, no guarding Ext:  2 plus pulses, no edema, no cyanosis, no clubbing Skin:  No rashes no nodules Neuro:  CN II through XII intact, motor grossly intact  EKG - NSR with ventricular pacing, dysynchronous  DEVICE  Normal device function.  See PaceArt for details.  Device is at [facility].  Assess/Plan: 1. CHB - she is s/p PPM. She did well until reaching ERI. 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 2. PM - her medtronic DDD PM is working normally. Will recheck in several months. She will undergo gen change in a week or two.  [name].D.

## 2017-11-13 NOTE — Patient Instructions (Addendum)
Medication Instructions:  Your physician recommends that you continue on your current medications as directed. Please refer to the Current Medication list given to you today.  Labwork: You will need lab work today:  BMP and CBC.  Testing/Procedures: Your physician has recommended that you have a pacemaker inserted. A pacemaker is a small device that is placed under the skin of your chest or abdomen to help control abnormal heart rhythms. This device uses electrical pulses to prompt the heart to beat at a normal rate. Pacemakers are used to treat heart rhythms that are too slow. Wire (leads) are attached to the pacemaker that goes into the chambers of you heart. This is done in the hospital and usually requires and overnight stay. Please see the instruction sheet given to you today for more information.  Follow-Up: You will follow up with the device clinic 10-14 days after your procedure for a wound check.  You will follow up with Dr. Lovena Le 91 days after your procedure.  Any Other Special Instructions Will Be Listed Below (If Applicable).  Please arrive at the [facility] main entrance of [facility] hospital at:  12:00 pm on November 24, 2017. Use the CHG scrub given to you today.  Follow the instruction sheet. Do not eat after midnight prior to procedure-you may have small sips of water in the AM. Do not take any medications the morning of the procedure You will be discharged after your procedure.  You will need someone to drive you home at discharge  If you need a refill on your cardiac medications before your next appointment, please call your pharmacy.

## 2017-11-24 ENCOUNTER — Ambulatory Visit (HOSPITAL_COMMUNITY)
Admission: RE | Disposition: A | Discharge status: Home / Self Care | Payer: Self-pay | Source: Ambulatory Visit | Attending: Internal Medicine

## 2017-11-24 ENCOUNTER — Ambulatory Visit (HOSPITAL_COMMUNITY)
Admission: RE | Admit: 2017-11-24 | Discharge: 2017-11-24 | Disposition: A | Discharge status: Home / Self Care | Payer: Medicare Other | Source: Ambulatory Visit | Attending: Internal Medicine | Admitting: Internal Medicine

## 2017-11-24 DIAGNOSIS — Z7982 Long term (current) use of aspirin: Secondary | ICD-10-CM | POA: Insufficient documentation

## 2017-11-24 DIAGNOSIS — Z79899 Other long term (current) drug therapy: Secondary | ICD-10-CM | POA: Diagnosis not present

## 2017-11-24 DIAGNOSIS — E785 Hyperlipidemia, unspecified: Secondary | ICD-10-CM | POA: Diagnosis not present

## 2017-11-24 DIAGNOSIS — E119 Type 2 diabetes mellitus without complications: Secondary | ICD-10-CM | POA: Diagnosis not present

## 2017-11-24 DIAGNOSIS — Z7984 Long term (current) use of oral hypoglycemic drugs: Secondary | ICD-10-CM | POA: Insufficient documentation

## 2017-11-24 DIAGNOSIS — I442 Atrioventricular block, complete: Secondary | ICD-10-CM | POA: Diagnosis not present

## 2017-11-24 DIAGNOSIS — Z45010 Encounter for checking and testing of cardiac pacemaker pulse generator [battery]: Secondary | ICD-10-CM | POA: Insufficient documentation

## 2017-11-24 DIAGNOSIS — I10 Essential (primary) hypertension: Secondary | ICD-10-CM | POA: Diagnosis not present

## 2017-11-24 DIAGNOSIS — Z833 Family history of diabetes mellitus: Secondary | ICD-10-CM | POA: Insufficient documentation

## 2017-11-24 DIAGNOSIS — Z86010 Personal history of colonic polyps: Secondary | ICD-10-CM

## 2017-11-24 DIAGNOSIS — Z950 Presence of cardiac pacemaker: Secondary | ICD-10-CM | POA: Diagnosis present

## 2017-11-24 HISTORY — PX: PPM GENERATOR CHANGEOUT: EP1233

## 2017-11-24 LAB — SURGICAL PCR SCREEN
MRSA, PCR: NEGATIVE
STAPHYLOCOCCUS AUREUS: NEGATIVE

## 2017-11-24 LAB — GLUCOSE, CAPILLARY
GLUCOSE-CAPILLARY: 65 mg/dL (ref 65–99)
GLUCOSE-CAPILLARY: 75 mg/dL (ref 65–99)
Glucose-Capillary: 126 mg/dL — ABNORMAL HIGH (ref 65–99)

## 2017-11-24 SURGERY — PPM GENERATOR CHANGEOUT

## 2017-11-24 MED ORDER — SODIUM CHLORIDE 0.9 % IV SOLN
INTRAVENOUS | Status: DC
  Administered 2017-11-24: 13:00:00 via INTRAVENOUS

## 2017-11-24 MED ORDER — ACETAMINOPHEN 325 MG PO TABS: 325.0000 mg | ORAL_TABLET | ORAL | Status: DC | PRN

## 2017-11-24 MED ORDER — CEFAZOLIN SODIUM-DEXTROSE 2-4 GM/100ML-% IV SOLN
2.0000 g | INTRAVENOUS | Status: AC
  Administered 2017-11-24: 2 g via INTRAVENOUS

## 2017-11-24 MED ORDER — MIDAZOLAM HCL 5 MG/5ML IJ SOLN
INTRAMUSCULAR | Status: AC
  Filled 2017-11-24: qty 5

## 2017-11-24 MED ORDER — SODIUM CHLORIDE 0.9 % IR SOLN
Status: AC
  Filled 2017-11-24: qty 2

## 2017-11-24 MED ORDER — DEXTROSE 50 % IV SOLN
INTRAVENOUS | Status: AC
  Administered 2017-11-24: 25 mL
  Filled 2017-11-24: qty 50

## 2017-11-24 MED ORDER — FENTANYL CITRATE (PF) 100 MCG/2ML IJ SOLN
INTRAMUSCULAR | Status: AC
  Filled 2017-11-24: qty 2

## 2017-11-24 MED ORDER — MUPIROCIN 2 % EX OINT
TOPICAL_OINTMENT | CUTANEOUS | Status: AC
  Administered 2017-11-24: 1
  Filled 2017-11-24: qty 22

## 2017-11-24 MED ORDER — ONDANSETRON HCL 4 MG/2ML IJ SOLN: 4.0000 mg | Freq: Four times a day (QID) | INTRAMUSCULAR | Status: DC | PRN

## 2017-11-24 MED ORDER — CHLORHEXIDINE GLUCONATE 4 % EX LIQD
60.0000 mL | Freq: Once | CUTANEOUS | Status: DC
  Filled 2017-11-24: qty 60

## 2017-11-24 MED ORDER — SODIUM CHLORIDE 0.9 % IR SOLN
80.0000 mg | Status: AC
  Administered 2017-11-24: 80 mg

## 2017-11-24 MED ORDER — LIDOCAINE HCL (PF) 1 % IJ SOLN
INTRAMUSCULAR | Status: DC | PRN
  Administered 2017-11-24: 60 mL

## 2017-11-24 MED ORDER — CEFAZOLIN SODIUM-DEXTROSE 2-4 GM/100ML-% IV SOLN
INTRAVENOUS | Status: AC
  Filled 2017-11-24: qty 100

## 2017-11-24 MED ORDER — LIDOCAINE HCL (PF) 1 % IJ SOLN
INTRAMUSCULAR | Status: AC
  Filled 2017-11-24: qty 60

## 2017-11-24 SURGICAL SUPPLY — 5 items
CABLE SURGICAL S-101-97-12 (CABLE) ×3 IMPLANT
IPG PACE AZUR XT DR MRI W1DR01 (Pacemaker) ×1 IMPLANT
PACE AZURE XT DR MRI W1DR01 (Pacemaker) ×3 IMPLANT
PAD DEFIB LIFELINK (PAD) ×3 IMPLANT
TRAY PACEMAKER INSERTION (PACKS) ×3 IMPLANT

## 2017-11-24 NOTE — Progress Notes (Signed)
Per Dr Lovena Le d/c at 1700 not 1800

## 2017-11-24 NOTE — Discharge Instructions (Signed)
Pacemaker Battery Change, Care After This sheet gives you information about how to care for yourself after your procedure. Your health care provider may also give you more specific instructions. If you have problems or questions, contact your health care provider. What can I expect after the procedure? After your procedure, it is common to have:  Pain or soreness at the site where the pacemaker was inserted.  Swelling at the site where the pacemaker was inserted.  Follow these instructions at home: Incision care  Keep the incision clean and dry. REMOVE OUTER DRESSING TOMORROW AND LEAVE STERI-STRIPS ON; DO NO PULL OFF OR GET SITE WET ? Do not take baths, swim, or use a hot tub until your health care provider approves. ? Pat the area dry with a clean towel. Do not rub the area. This may cause bleeding.  Follow instructions from your health care provider about how to take care of your incision. Make sure you: ? Wash your hands with soap and water before you change your bandage (dressing). If soap and water are not available, use hand sanitizer. ? Change your dressing as told by your health care provider. ? Leave stitches (sutures), skin glue, or adhesive strips in place. These skin closures may need to stay in place for 2 weeks or longer. If adhesive strip edges start to loosen and curl up, you may trim the loose edges. Do not remove adhesive strips completely unless your health care provider tells you to do that.  Check your incision area every day for signs of infection. Check for: ? More redness, swelling, or pain. ? More fluid or blood. ? Warmth. ? Pus or a bad smell. Activity  Do not lift anything that is heavier than 10 lb (4.5 kg) until your health care provider says it is okay to do so.  For the first 2 weeks, or as long as told by your health care provider: ? Avoid lifting your left arm higher than your shoulder. ? Be gentle when you move your arms over your head. It is okay to  raise your arm to comb your hair. ? Avoid strenuous exercise.  Ask your health care provider when it is okay to: ? Resume your normal activities. ? Return to work or school. ? Resume sexual activity. Eating and drinking  Eat a heart-healthy diet. This should include plenty of fresh fruits and vegetables, whole grains, low-fat dairy products, and lean protein like chicken and fish.  Limit alcohol intake to no more than 1 drink a day for non-pregnant women and 2 drinks a day for men. One drink equals 12 oz of beer, 5 oz of wine, or 1 oz of hard liquor.  Check ingredients and nutrition facts on packaged foods and beverages. Avoid the following types of food: ? Food that is high in salt (sodium). ? Food that is high in saturated fat, like full-fat dairy or red meat. ? Food that is high in trans fat, like fried food. ? Food and drinks that are high in sugar. Lifestyle  Do not use any products that contain nicotine or tobacco, such as cigarettes and e-cigarettes. If you need help quitting, ask your health care provider.  Take steps to manage and control your weight.  Get regular exercise. Aim for 150 minutes of moderate-intensity exercise (such as walking or yoga) or 75 minutes of vigorous exercise (such as running or swimming) each week.  Manage other health problems, such as diabetes or high blood pressure. Ask your health care  provider how you can manage these conditions. General instructions  Do not drive for 24 hours after your procedure if you were given a medicine to help you relax (sedative).  Take over-the-counter and prescription medicines only as told by your health care provider.  Avoid putting pressure on the area where the pacemaker was placed.  If you need an MRI after your pacemaker has been placed, be sure to tell the health care provider who orders the MRI that you have a pacemaker.  Avoid close and prolonged exposure to electrical devices that have strong magnetic  fields. These include: ? Cell phones. Avoid keeping them in a pocket near the pacemaker, and try using the ear opposite the pacemaker. ? MP3 players. ? Household appliances, like microwaves. ? Metal detectors. ? Electric generators. ? High-tension wires.  Keep all follow-up visits as directed by your health care provider. This is important. Contact a health care provider if:  You have pain at the incision site that is not relieved by over-the-counter or prescription medicines.  You have any of these around your incision site or coming from it: ? More redness, swelling, or pain. ? Fluid or blood. ? Warmth to the touch. ? Pus or a bad smell.  You have a fever.  You feel brief, occasional palpitations, light-headedness, or any symptoms that you think might be related to your heart. Get help right away if:  You experience chest pain that is different from the pain at the pacemaker site.  You develop a red streak that extends above or below the incision site.  You experience shortness of breath.  You have palpitations or an irregular heartbeat.  You have light-headedness that does not go away quickly.  You faint or have dizzy spells.  Your pulse suddenly drops or increases rapidly and does not return to normal.  You begin to gain weight and your legs and ankles swell. Summary  After your procedure, it is common to have pain, soreness, and some swelling where the pacemaker was inserted.  Make sure to keep your incision clean and dry. Follow instructions from your health care provider about how to take care of your incision.  Check your incision every day for signs of infection, such as more pain or swelling, pus or a bad smell, warmth, or leaking fluid and blood.  Avoid strenuous exercise and lifting your left arm higher than your shoulder for 2 weeks, or as long as told by your health care provider. This information is not intended to replace advice given to you by your  health care provider. Make sure you discuss any questions you have with your health care provider. Document Released: 08/28/2013 Document Revised: 09/29/2016 Document Reviewed: 09/29/2016 Elsevier Interactive Patient Education  2017 [employer].

## 2017-11-24 NOTE — Interval H&P Note (Signed)
History and Physical Interval Note:  11/24/2017 2:55 PM  [name]  has presented today for surgery, with the diagnosis of eri  The various methods of treatment have been discussed with the patient and family. After consideration of risks, benefits and other options for treatment, the patient has consented to  Procedure(s): PPM GENERATOR CHANGEOUT (N/A) as a surgical intervention .  The patient's history has been reviewed, patient examined, no change in status, stable for surgery.  I have reviewed the patient's chart and labs.  Questions were answered to the patient's satisfaction.     Cristopher Peru

## 2017-11-27 ENCOUNTER — Encounter (HOSPITAL_COMMUNITY): Payer: Self-pay | Admitting: Internal Medicine

## 2017-11-27 MED FILL — Cefazolin Sodium-Dextrose IV Solution 2 GM/100ML-4%: INTRAVENOUS | Qty: 100 | Status: AC

## 2017-11-27 MED FILL — Fentanyl Citrate Preservative Free (PF) Inj 100 MCG/2ML: INTRAMUSCULAR | Qty: 2 | Status: AC

## 2017-11-27 MED FILL — Midazolam HCl Inj 5 MG/5ML (Base Equivalent): INTRAMUSCULAR | Qty: 5 | Status: AC

## 2017-11-27 MED FILL — Gentamicin Sulfate Inj 40 MG/ML: INTRAMUSCULAR | Qty: 80 | Status: AC

## 2017-12-06 ENCOUNTER — Ambulatory Visit (INDEPENDENT_AMBULATORY_CARE_PROVIDER_SITE_OTHER): Payer: Medicare Other | Admitting: *Deleted

## 2017-12-06 DIAGNOSIS — I442 Atrioventricular block, complete: Secondary | ICD-10-CM

## 2017-12-06 LAB — CUP PACEART INCLINIC DEVICE CHECK
Battery Remaining Longevity: 136 mo
Battery Voltage: 3.19 V
Brady Statistic AS VS Percent: 0 %
Brady Statistic RA Percent Paced: 2.54 %
Implantable Lead Implant Date: 20061024
Implantable Lead Location: 753859
Implantable Lead Model: 5076
Implantable Pulse Generator Implant Date: 20190104
Lead Channel Impedance Value: 209 Ohm
Lead Channel Impedance Value: 380 Ohm
Lead Channel Impedance Value: 456 Ohm
Lead Channel Pacing Threshold Amplitude: 0.75 V
Lead Channel Pacing Threshold Pulse Width: 0.4 ms
Lead Channel Pacing Threshold Pulse Width: 0.4 ms
Lead Channel Setting Pacing Amplitude: 1.5 V
Lead Channel Setting Pacing Pulse Width: 0.4 ms
Lead Channel Setting Sensing Sensitivity: 4 mV
MDC IDC LEAD IMPLANT DT: 20061024
MDC IDC LEAD LOCATION: 753860
MDC IDC MSMT LEADCHNL RA IMPEDANCE VALUE: 342 Ohm
MDC IDC MSMT LEADCHNL RA SENSING INTR AMPL: 2 mV
MDC IDC MSMT LEADCHNL RA SENSING INTR AMPL: 2.25 mV
MDC IDC MSMT LEADCHNL RV PACING THRESHOLD AMPLITUDE: 0.5 V
MDC IDC SESS DTM: 20190116095006
MDC IDC SET LEADCHNL RV PACING AMPLITUDE: 2.5 V
MDC IDC STAT BRADY AP VP PERCENT: 2.55 %
MDC IDC STAT BRADY AP VS PERCENT: 0 %
MDC IDC STAT BRADY AS VP PERCENT: 97.45 %
MDC IDC STAT BRADY RV PERCENT PACED: 100 %

## 2017-12-06 NOTE — Progress Notes (Signed)
Wound check appointment. Steri-strips removed. Wound without redness or edema. Incision edges approximated, wound well healed. Normal device function. Thresholds, sensing, and impedances consistent with implant measurements. Device programmed chronic values s/p gen change. Histogram distribution appropriate for patient and level of activity. No mode switches or high ventricular rates noted. Patient educated about wound care. ROV 4/2 with GT

## 2017-12-19 ENCOUNTER — Encounter: Payer: Medicare Other | Admitting: Internal Medicine

## 2018-01-05 ENCOUNTER — Telehealth: Payer: Self-pay | Admitting: Internal Medicine

## 2018-01-05 NOTE — Telephone Encounter (Addendum)
Patient c/o bilateral feet and ankle swelling that is worse than it was before PPM insertion. Patient said she was informed that her swelling would improve after her PPM was placed. Patient said when she wakes up, her feet/ankles aren't swollen but the swelling gets worse later in the day. Patient admits to adding salts to food and eating food high in sodium. Patient educated on not adding salts to foods and selecting low sodium food. Patient said she does not wear compression stockings. Patient said that she has one pair of shoes that she can wear but has to leave them unzipped. No changes in weight and current weight is around 126 lbs. No c/o chest pain, dizziness or sob. Saw PCP one week ago and informed PCP about the swelling but no treatment was given. Patient was given an appointment for Monday, 01/08/18 @2 :00 pm with Lenze. Patient verbalized understanding of plan.

## 2018-01-05 NOTE — Telephone Encounter (Signed)
New message    Pt c/o swelling: STAT is pt has developed SOB within 24 hours  1) How much weight have you gained and in what time span? 1 MONTH  2) If swelling, where is the swelling located? FEET/ ANKLES  3) Are you currently taking a fluid pill? YES  4) Are you currently SOB? NO  5) Do you have a log of your daily weights (if so, list)? NO  Have you gained 3 pounds in a day or 5 pounds in a week? N/A 6) Have you traveled recently? NO

## 2018-01-08 ENCOUNTER — Ambulatory Visit: Payer: Medicare Other | Admitting: Physician Assistant

## 2018-01-08 ENCOUNTER — Encounter: Payer: Self-pay | Admitting: Physician Assistant

## 2018-01-08 VITALS — BP 138/80 | HR 75 | Ht 60.0 in | Wt 127.8 lb

## 2018-01-08 DIAGNOSIS — Z950 Presence of cardiac pacemaker: Secondary | ICD-10-CM | POA: Diagnosis not present

## 2018-01-08 DIAGNOSIS — I10 Essential (primary) hypertension: Secondary | ICD-10-CM

## 2018-01-08 DIAGNOSIS — R609 Edema, unspecified: Secondary | ICD-10-CM

## 2018-01-08 NOTE — Progress Notes (Signed)
.  chmgc

## 2018-01-08 NOTE — Progress Notes (Signed)
Cardiology Office Note    Date:  01/08/2018   ID:  [name], [name] Apr 10, 1935, MRN [id number]  PCP:  Barbara Pao, MD  Cardiologist: Barbara Peru, MD  Chief Complaint  Patient presents with  . Edema    History of Present Illness:  [name] is a 83 y.o. female history of complete heart block status post pacemaker that reached ERI.  She developed pacemaker syndrome due to this and underwent ICD implant 12/06/17  Patient also has history of hypertension, dyslipidemia, DM.  Patient called in 01/05/18 complaining of feet and ankle swelling that has become worse since pacemaker insertion.  She was hoping for pacemaker would take care of her problems.    They are not swollen when she gets up in the am.  She does eat a lot of salt.  The more she is on her feet the worse it gets.  She denies dyspnea or chest pain.  She has not had an echo since 2006 at which time she had normal LV function.    Past Medical History:  Diagnosis Date  . Atrioventricular block, complete ([place])   . Diverticulosis   . DM (diabetes mellitus) ([place])   . Hemorrhoids   . HTN (hypertension)   . Hyperlipidemia   . Osteoporosis   . Pacemaker   . Tubular adenoma of colon 03/2010    Past Surgical History:  Procedure Laterality Date  . BRAIN BIOPSY  1998   Benign  . CARDIAC CATHETERIZATION  09/12/05   Dr. Jenkins Rouge   . COLONOSCOPY  2013   Stark -polyps  . PACEMAKER INSERTION  09/13/05   Medtronic, dual chamber. Dr. Lovena Le   . PPM GENERATOR CHANGEOUT N/A 11/24/2017   Procedure: PPM GENERATOR CHANGEOUT;  Surgeon: Evans Lance, MD;  Location: [facility];  Service: Cardiovascular;  Laterality: N/A;  . TOTAL ABDOMINAL HYSTERECTOMY W/ BILATERAL SALPINGOOPHORECTOMY    . WISDOM TOOTH EXTRACTION      Current Medications: Current Meds  Medication Sig  . acetaminophen (TYLENOL) 325 MG tablet Take 325 mg by mouth every 6 (six) hours as needed (for pain/headaches.).  Marland Kitchen aspirin EC 81  MG tablet Take 81 mg by mouth daily.  . Calcium Carb-Cholecalciferol (CALCIUM 500 + D3) 500-200 MG-UNIT TABS Take 1 tablet by mouth every other day.  . Cholecalciferol (VITAMIN D3) 2000 units TABS Take 2,000 Units by mouth daily.  Marland Kitchen glipiZIDE (GLUCOTROL XL) 10 MG 24 hr tablet Take 10 mg by mouth 2 (two) times daily.    Marland Kitchen lisinopril-hydrochlorothiazide (PRINZIDE,ZESTORETIC) 20-25 MG per tablet Take 1 tablet by mouth daily.    . meclizine (ANTIVERT) 25 MG tablet Take 25 mg by mouth 2 (two) times daily as needed for dizziness or nausea.   . metoprolol (LOPRESSOR) 50 MG tablet Take 50 mg by mouth 2 (two) times daily.    . Multiple Vitamin (MULTIVITAMIN WITH MINERALS) TABS tablet Take 1 tablet by mouth daily.  . pioglitazone (ACTOS) 15 MG tablet Take 15 mg by mouth daily.  . simvastatin (ZOCOR) 40 MG tablet Take 40 mg by mouth daily.   . sitaGLIPtan-metformin (JANUMET) 50-1000 MG per tablet Take 1 tablet by mouth 2 (two) times daily.    Current Facility-Administered Medications for the 01/08/18 encounter (Office Visit) with Imogene Burn, PA-C  Medication  . 0.9 %  sodium chloride infusion     Allergies:   Sulfa drugs cross reactors   Social History   Socioeconomic History  . Marital  status: Married    Spouse name: None  . Number of children: 1  . Years of education: None  . Highest education level: None  Social Needs  . Financial resource strain: None  . Food insecurity - worry: None  . Food insecurity - inability: None  . Transportation needs - medical: None  . Transportation needs - non-medical: None  Occupational History  . Occupation: Retired from [employer] store    Employer: RETIRED  Tobacco Use  . Smoking status: Never Smoker  . Smokeless tobacco: Never Used  . Tobacco comment: tobacco use - no  Substance and Sexual Activity  . Alcohol use: No  . Drug use: No  . Sexual activity: None    Comment: Hysterectomy  Other Topics Concern  . None  Social History Narrative   Lives  in [place] with her husband.      Family History:  The patient's family history includes Diabetes in her father; Lung cancer in her father.   ROS:   Please see the history of present illness.    Review of Systems  Constitution: Negative.  HENT: Negative.   Eyes: Negative.   Cardiovascular: Positive for leg swelling.  Respiratory: Negative.   Hematologic/Lymphatic: Negative.   Musculoskeletal: Negative.  Negative for joint pain.  Gastrointestinal: Negative.   Genitourinary: Negative.   Neurological: Negative.    All other systems reviewed and are negative.   PHYSICAL EXAM:   VS:  BP 138/80   Pulse 75   Ht 5' (1.524 m)   Wt 127 lb 12.8 oz (58 kg)   SpO2 99%   BMI 24.96 kg/m   Physical Exam  GEN: Well nourished, well developed, in no acute distress  Neck: no JVD, carotid bruits, or masses Cardiac: Pacer site healing well RRR; 1/6 systolic murmur at the left sternal border Respiratory:  clear to auscultation bilaterally, normal work of breathing GI: soft, nontender, nondistended, + BS Ext: Puffy 1+ ankle edema without cyanosis, clubbing, , Good distal pulses bilaterally Neuro:  Alert and Oriented x 3 Psych: euthymic mood, full affect  Wt Readings from Last 3 Encounters:  01/08/18 127 lb 12.8 oz (58 kg)  11/24/17 127 lb (57.6 kg)  11/13/17 127 lb 9.6 oz (57.9 kg)      Studies/Labs Reviewed:   EKG:  EKG is not ordered today.   Recent Labs: 11/13/2017: BUN 31; Creatinine, Ser 1.89; Hemoglobin 10.9; Platelets 297; Potassium 4.1; Sodium 142   Lipid Panel No results found for: CHOL, TRIG, HDL, CHOLHDL, VLDL, LDLCALC, LDLDIRECT  Additional studies/ records that were reviewed today include:  2D echo 2006 LEFT VENTRICLE:  -  The left ventricle was small.  -  Overall left ventricular systolic function was normal.  -  Left ventricular ejection fraction was estimated , range being 55        % to 65 %..  -  Left ventricular wall thickness was normal.  AORTIC VALVE:  -   Aortic valve thickness was mildly increased.   Doppler interpretation(s):  -  There was no significant aortic valvular regurgitation.  MITRAL VALVE:  -  The mitral valve was grossly normal.   Doppler interpretation(s):  -  There was no significant mitral valvular regurgitation.  RIGHT VENTRICLE:  -  Right ventricular size was normal.  -  Right ventricular systolic function was normal.  PULMONIC VALVE:  -  The structure of the pulmonic valve appeared to be normal.   Doppler interpretation(s):  -  There was no significant pulmonic  regurgitation.  TRICUSPID VALVE:  -  The tricuspid valve structure was normal.   Doppler interpretation(s):  -  There was no significant tricuspid valvular regurgitation.  RIGHT ATRIUM:  -  Right atrial size was normal.  PERICARDIUM:  -  There was no pericardial effusion.   ---------------------------------------------------------------  SUMMARY  -  The left ventricle was small. Overall left ventricular systolic        function was normal. Left ventricular ejection fraction was        estimated , range being 55 % to 65 %..      ASSESSMENT:    1. Edema, unspecified type   2. PACEMAKER, PERMANENT   3. Essential hypertension      PLAN:  In order of problems listed above:  Dependent leg edema that is worse as the day goes on.  She is getting extra salt in her diet.  She is on Zestoretic 20/25 mg daily.  She has not had an echo since 2006.  Recommend 2 g sodium diet, compression stockings, 2D echo and follow-up with me next week.  She is also on Actos which could be contributing to this but she has been on it for 10 years.  Creatinine was 1.89 in December  Pacemaker which was just revised last month.  Followed by Dr. Lovena Le.  Essential hypertension blood pressure stable    Medication Adjustments/Labs and Tests Ordered: Current medicines are reviewed at length with the patient today.  Concerns regarding medicines are outlined above.  Medication  changes, Labs and Tests ordered today are listed in the Patient Instructions below. There are no Patient Instructions on file for this visit.   Sumner Boast, PA-C  01/08/2018 2:38 PM    [facility] [address], [place],   [address] Phone: [phone]; Fax: [phone]

## 2018-01-08 NOTE — Patient Instructions (Signed)
Medication Instructions:  Your physician recommends that you continue on your current medications as directed. Please refer to the Current Medication list given to you today.   Labwork: -None  Testing/Procedures: Your physician has requested that you have an echocardiogram. Echocardiography is a painless test that uses sound waves to create images of your heart. It provides your doctor with information about the size and shape of your heart and how well your heart's chambers and valves are working. This procedure takes approximately one hour. There are no restrictions for this procedure.    Follow-Up: Your physician recommends that you keep your scheduled  follow-up appointment with Ermalinda Barrios, PA.   Any Other Special Instructions Will Be Listed Below (If Applicable).   1. Get compression hose, prescription was given to patient today.  Two Gram Sodium Diet 2000 mg  What is Sodium? Sodium is a mineral found naturally in many foods. The most significant source of sodium in the diet is table salt, which is about 40% sodium.  Processed, convenience, and preserved foods also contain a large amount of sodium.  The body needs only 500 mg of sodium daily to function,  A normal diet provides more than enough sodium even if you do not use salt.  Why Limit Sodium? A build up of sodium in the body can cause thirst, increased blood pressure, shortness of breath, and water retention.  Decreasing sodium in the diet can reduce edema and risk of heart attack or stroke associated with high blood pressure.  Keep in mind that there are many other factors involved in these health problems.  Heredity, obesity, lack of exercise, cigarette smoking, stress and what you eat all play a role.  General Guidelines:  Do not add salt at the table or in cooking.  One teaspoon of salt contains over 2 grams of sodium.  Read food labels  Avoid processed and convenience foods  Ask your dietitian before eating any  foods not dicussed in the menu planning guidelines  Consult your physician if you wish to use a salt substitute or a sodium containing medication such as antacids.  Limit milk and milk products to 16 oz (2 cups) per day.  Shopping Hints:  READ LABELS!! "Dietetic" does not necessarily mean low sodium.  Salt and other sodium ingredients are often added to foods during processing.   Menu Planning Guidelines Food Group Choose More Often Avoid  Beverages (see also the milk group All fruit juices, low-sodium, salt-free vegetables juices, low-sodium carbonated beverages Regular vegetable or tomato juices, commercially softened water used for drinking or cooking  Breads and Cereals Enriched white, wheat, rye and pumpernickel bread, hard rolls and dinner rolls; muffins, cornbread and waffles; most dry cereals, cooked cereal without added salt; unsalted crackers and breadsticks; low sodium or homemade bread crumbs Bread, rolls and crackers with salted tops; quick breads; instant hot cereals; pancakes; commercial bread stuffing; self-rising flower and biscuit mixes; regular bread crumbs or cracker crumbs  Desserts and Sweets Desserts and sweets mad with mild should be within allowance Instant pudding mixes and cake mixes  Fats Butter or margarine; vegetable oils; unsalted salad dressings, regular salad dressings limited to 1 Tbs; light, sour and heavy cream Regular salad dressings containing bacon fat, bacon bits, and salt pork; snack dips made with instant soup mixes or processed cheese; salted nuts  Fruits Most fresh, frozen and canned fruits Fruits processed with salt or sodium-containing ingredient (some dried fruits are processed with sodium sulfites  Vegetables Fresh, frozen vegetables and low- sodium canned vegetables Regular canned vegetables, sauerkraut, pickled vegetables, and others prepared in brine; frozen vegetables in sauces; vegetables seasoned with ham, bacon or salt pork   Condiments, Sauces, Miscellaneous  Salt substitute with physician's approval; pepper, herbs, spices; vinegar, lemon or lime juice; hot pepper sauce; garlic powder, onion powder, low sodium soy sauce (1 Tbs.); low sodium condiments (ketchup, chili sauce, mustard) in limited amounts (1 tsp.) fresh ground horseradish; unsalted tortilla chips, pretzels, potato chips, popcorn, salsa (1/4 cup) Any seasoning made with salt including garlic salt, celery salt, onion salt, and seasoned salt; sea salt, rock salt, kosher salt; meat tenderizers; monosodium glutamate; mustard, regular soy sauce, barbecue, sauce, chili sauce, teriyaki sauce, steak sauce, Worcestershire sauce, and most flavored vinegars; canned gravy and mixes; regular condiments; salted snack foods, olives, picles, relish, horseradish sauce, catsup   Food preparation: Try these seasonings Meats:    Pork Sage, onion Serve with applesauce  Chicken Poultry seasoning, thyme, parsley Serve with cranberry sauce  Lamb Curry powder, rosemary, garlic, thyme Serve with mint sauce or jelly  Veal Marjoram, basil Serve with current jelly, cranberry sauce  Beef Pepper, bay leaf Serve with dry mustard, unsalted chive butter  Fish [place] leaf, dill Serve with unsalted lemon butter, unsalted parsley butter  Vegetables:    Asparagus Lemon juice   Broccoli Lemon juice   Carrots Mustard dressing parsley, mint, nutmeg, glazed with unsalted butter and sugar   Green beans Marjoram, lemon juice, nutmeg,dill seed   Tomatoes Basil, marjoram, onion   Spice /blend for [employer]" 4 tsp ground thyme 1 tsp ground sage 3 tsp ground rosemary 4 tsp ground marjoram   Test your knowledge 1. A product that says "Salt Free" may still contain sodium. True or False 2. Garlic Powder and Hot Pepper Sauce an be used as alternative seasonings.True or False 3. Processed foods have more sodium than fresh foods.  True or False 4. Canned Vegetables have less sodium than froze True or  False  WAYS TO DECREASE YOUR SODIUM INTAKE 1. Avoid the use of added salt in cooking and at the table.  Table salt (and other prepared seasonings which contain salt) is probably one of the greatest sources of sodium in the diet.  Unsalted foods can gain flavor from the sweet, sour, and butter taste sensations of herbs and spices.  Instead of using salt for seasoning, try the following seasonings with the foods listed.  Remember: how you use them to enhance natural food flavors is limited only by your creativity... Allspice-Meat, fish, eggs, fruit, peas, red and yellow vegetables Almond Extract-Fruit baked goods Anise Seed-Sweet breads, fruit, carrots, beets, cottage cheese, cookies (tastes like licorice) Basil-Meat, fish, eggs, vegetables, rice, vegetables salads, soups, sauces Bay Leaf-Meat, fish, stews, poultry Burnet-Salad, vegetables (cucumber-like flavor) Caraway Seed-Bread, cookies, cottage cheese, meat, vegetables, cheese, rice Cardamon-Baked goods, fruit, soups Celery Powder or seed-Salads, salad dressings, sauces, meatloaf, soup, bread.Do not use  celery salt Chervil-Meats, salads, fish, eggs, vegetables, cottage cheese (parsley-like flavor) Chili Power-Meatloaf, chicken cheese, corn, eggplant, egg dishes Chives-Salads cottage cheese, egg dishes, soups, vegetables, sauces Cilantro-Salsa, casseroles Cinnamon-Baked goods, fruit, pork, lamb, chicken, carrots Cloves-Fruit, baked goods, fish, pot roast, green beans, beets, carrots Coriander-Pastry, cookies, meat, salads, cheese (lemon-orange flavor) Cumin-Meatloaf, fish,cheese, eggs, cabbage,fruit pie (caraway flavor) [employer], fruit, eggs, fish, poultry, cottage cheese, vegetables Dill Seed-Meat, cottage cheese, poultry, vegetables, fish, salads, bread Fennel Seed-Bread, cookies, apples, pork, eggs, fish, beets, cabbage, cheese, Licorice-like flavor Garlic-(buds or powder)  Salads, meat, poultry, fish, bread, butter, vegetables,  potatoes.Do not  use garlic salt Ginger-Fruit, vegetables, baked goods, meat, fish, poultry Horseradish Root-Meet, vegetables, butter Lemon Juice or Extract-Vegetables, fruit, tea, baked goods, fish salads Mace-Baked goods fruit, vegetables, fish, poultry (taste like nutmeg) Maple Extract-Syrups Marjoram-Meat, chicken, fish, vegetables, breads, green salads (taste like Sage) Mint-Tea, lamb, sherbet, vegetables, desserts, carrots, cabbage Mustard, Dry or Seed-Cheese, eggs, meats, vegetables, poultry Nutmeg-Baked goods, fruit, chicken, eggs, vegetables, desserts Onion Powder-Meat, fish, poultry, vegetables, cheese, eggs, bread, rice salads (Do not use   Onion salt) Orange Extract-Desserts, baked goods Oregano-Pasta, eggs, cheese, onions, pork, lamb, fish, chicken, vegetables, green salads Paprika-Meat, fish, poultry, eggs, cheese, vegetables Parsley Flakes-Butter, vegetables, meat fish, poultry, eggs, bread, salads (certain forms may   Contain sodium Pepper-Meat fish, poultry, vegetables, eggs Peppermint Extract-Desserts, baked goods Poppy Seed-Eggs, bread, cheese, fruit dressings, baked goods, noodles, vegetables, cottage  [employer], poultry, meat, fish, cauliflower, turnips,eggs bread Saffron-Rice, bread, veal, chicken, fish, eggs Sage-Meat, fish, poultry, onions, eggplant, tomateos, pork, stews Savory-Eggs, salads, poultry, meat, rice, vegetables, soups, pork Tarragon-Meat, poultry, fish, eggs, butter, vegetables (licorice-like flavor)  Thyme-Meat, poultry, fish, eggs, vegetables, (clover-like flavor), sauces, soups Tumeric-Salads, butter, eggs, fish, rice, vegetables (saffron-like flavor) Vanilla Extract-Baked goods, candy Vinegar-Salads, vegetables, meat marinades Walnut Extract-baked goods, candy  2. Choose your Foods Wisely   The following is a list of foods to avoid which are high in sodium:  Meats-Avoid all smoked, canned, salt  cured, dried and kosher meat and fish as well as Anchovies   [employer] meats:Bologna, Liverwurst, Pastrami Canned meat or fish  Marinated herring Caviar    Pepperoni Corned Beef   Pizza Dried chipped beef  Salami Frozen breaded fish or meat Salt pork Frankfurters or hot dogs  Sardines Gefilte fish   Sausage Ham (boiled ham, Proscuitto Smoked butt    spiced ham)   Spam      TV Dinners Vegetables Canned vegetables (Regular) Relish Canned mushrooms  Sauerkraut Olives    Tomato juice Pickles  Bakery and Dessert Products Canned puddings  Cream pies Cheesecake   Decorated cakes Cookies  Beverages/Juices Tomato juice, regular  Gatorade   V-8 vegetable juice, regular  Breads and Cereals Biscuit mixes   Salted potato chips, corn chips, pretzels Bread stuffing mixes  Salted crackers and rolls Pancake and waffle mixes Self-rising flour  Seasonings Accent    Meat sauces Barbecue sauce  Meat tenderizer Catsup    Monosodium glutamate (MSG) Celery salt   Onion salt Chili sauce   Prepared mustard Garlic salt   Salt, seasoned salt, sea salt Gravy mixes   Soy sauce Horseradish   Steak sauce Ketchup   Tartar sauce Lite salt    Teriyaki sauce Marinade mixes   Worcestershire sauce  Others Baking powder   Cocoa and cocoa mixes Baking soda   Commercial casserole mixes Candy-caramels, chocolate  Dehydrated soups    Bars, fudge,nougats  Instant rice and pasta mixes Canned broth or soup  Maraschino cherries Cheese, aged and processed cheese and cheese spreads  Learning Assessment Quiz  Indicated T (for True) or F (for False) for each of the following statements:  1. _____ Fresh fruits and vegetables and unprocessed grains are generally low in sodium 2. _____ Water may contain a considerable amount of sodium, depending on the source 3. _____ You can always tell if a food is high in sodium by tasting it 4. _____ Certain laxatives my be high in sodium and  should be avoided  unless prescribed   by a physician or pharmacist 5. _____ Salt substitutes may be used freely by anyone on a sodium restricted diet 6. _____ Sodium is present in table salt, food additives and as a natural component of   most foods 7. _____ Table salt is approximately 90% sodium 8. _____ Limiting sodium intake may help prevent excess fluid accumulation in the body 9. _____ On a sodium-restricted diet, seasonings such as bouillon soy sauce, and    cooking wine should be used in place of table salt 10. _____ On an ingredient list, a product which lists monosodium glutamate as the first   ingredient is an appropriate food to include on a low sodium diet  Circle the best answer(s) to the following statements (Hint: there may be more than one correct answer)  11. On a low-sodium diet, some acceptable snack items are:    A. Olives  F. Bean dip   K. Grapefruit juice    B. Salted Pretzels G. Commercial Popcorn   L. Canned peaches    C. Carrot Sticks  H. Bouillon   M. Unsalted nuts   D. [place] fries  I. Peanut butter crackers N. Salami   E. Sweet pickles J. Tomato Juice   O. Pizza  12.  Seasonings that may be used freely on a reduced - sodium diet include   A. Lemon wedges F.Monosodium glutamate K. Celery seed    B.Soysauce   G. Pepper   L. Mustard powder   C. Sea salt  H. Cooking wine  M. Onion flakes   D. Vinegar  E. Prepared horseradish N. Salsa   E. Sage   J. Worcestershire sauce  O. Chutney   If you need a refill on your cardiac medications before your next appointment, please call your pharmacy.

## 2018-01-10 ENCOUNTER — Other Ambulatory Visit: Payer: Self-pay

## 2018-01-10 ENCOUNTER — Ambulatory Visit (HOSPITAL_COMMUNITY): Payer: Medicare Other | Attending: Cardiology

## 2018-01-10 DIAGNOSIS — I071 Rheumatic tricuspid insufficiency: Secondary | ICD-10-CM | POA: Diagnosis not present

## 2018-01-10 DIAGNOSIS — R609 Edema, unspecified: Secondary | ICD-10-CM | POA: Insufficient documentation

## 2018-01-10 DIAGNOSIS — E785 Hyperlipidemia, unspecified: Secondary | ICD-10-CM | POA: Insufficient documentation

## 2018-01-10 DIAGNOSIS — I10 Essential (primary) hypertension: Secondary | ICD-10-CM | POA: Diagnosis not present

## 2018-01-10 DIAGNOSIS — E119 Type 2 diabetes mellitus without complications: Secondary | ICD-10-CM | POA: Insufficient documentation

## 2018-01-11 ENCOUNTER — Telehealth: Payer: Self-pay | Admitting: *Deleted

## 2018-01-11 MED ORDER — FUROSEMIDE 40 MG PO TABS: 40.0000 mg | ORAL_TABLET | Freq: Every day | ORAL | 3 refills | Status: DC

## 2018-01-11 MED ORDER — LISINOPRIL 20 MG PO TABS: 20.0000 mg | ORAL_TABLET | Freq: Every day | ORAL | 3 refills | Status: DC

## 2018-01-11 NOTE — Telephone Encounter (Signed)
-----   Message from Imogene Burn, PA-C sent at 01/11/2018  7:13 AM EST ----- Echo shows normal heart function but increased right heart pressures. Recommend changing zestoretic to zestril 20 mg once daily. Add Lasix 40 mg once daily. Please make this change today.2 gm sodium diet. Keep f/u with me next week and will recheck kidney function

## 2018-01-15 ENCOUNTER — Ambulatory Visit: Payer: Medicare Other | Admitting: Physician Assistant

## 2018-01-15 ENCOUNTER — Encounter: Payer: Self-pay | Admitting: Physician Assistant

## 2018-01-15 VITALS — BP 122/86 | HR 75 | Ht 60.0 in | Wt 125.0 lb

## 2018-01-15 DIAGNOSIS — E1169 Type 2 diabetes mellitus with other specified complication: Secondary | ICD-10-CM | POA: Diagnosis not present

## 2018-01-15 DIAGNOSIS — E782 Mixed hyperlipidemia: Secondary | ICD-10-CM | POA: Diagnosis not present

## 2018-01-15 DIAGNOSIS — I272 Pulmonary hypertension, unspecified: Secondary | ICD-10-CM

## 2018-01-15 DIAGNOSIS — I10 Essential (primary) hypertension: Secondary | ICD-10-CM

## 2018-01-15 DIAGNOSIS — Z950 Presence of cardiac pacemaker: Secondary | ICD-10-CM | POA: Diagnosis not present

## 2018-01-15 NOTE — Patient Instructions (Addendum)
Medication Instructions:  Your physician recommends that you continue on your current medications as directed. Please refer to the Current Medication list given to you today.   Labwork: TODAY:  BMET  Testing/Procedures: None ordered  Follow-Up: Your physician recommends that you schedule a follow-up appointment in: [place]  You have been referred to Dr. Keenan Bachelor, Podiatrist     Any Other Special Instructions Will Be Listed Below (If Applicable).     If you need a refill on your cardiac medications before your next appointment, please call your pharmacy.

## 2018-01-15 NOTE — Progress Notes (Signed)
Cardiology Office Note    Date:  01/15/2018   ID:  [name], [name] 06-23-1935, MRN [id number]  PCP:  Haywood Pao, MD  Cardiologist: Cristopher Peru, MD  Chief Complaint  Patient presents with  . Follow-up    History of Present Illness:  [name] is a 83 y.o. female withhistory of complete heart block status post pacemaker that reached ERI.  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  Patient also has history of hypertension, dyslipidemia and diabetes mellitus.   I saw the patient 01/08/18 with dependent leg edema that was worse as the day went on.  She was getting extra salt in her diet.  I recommend 2 g sodium diet compression stockings and 2D echo which was done 01/10/18.  She had normal LVEF 65-70% with grade 1 DD and increased right ventricular systolic pressures consistent with moderate pulmonary hypertension.  I asked her to change her Zestoretic to Zestril 20 mg once daily and add Lasix 40 mg daily.  Patient comes in today feeling much better.  Her swelling is gone and her breathing is better.  She does have some foot cramps.  Check labs today.  She is asking for a foot doctor because she is diabetic.    Past Medical History:  Diagnosis Date  . Atrioventricular block, complete ([place])   . Diverticulosis   . DM (diabetes mellitus) ([place])   . Hemorrhoids   . HTN (hypertension)   . Hyperlipidemia   . Osteoporosis   . Pacemaker   . Tubular adenoma of colon 03/2010    Past Surgical History:  Procedure Laterality Date  . BRAIN BIOPSY  1998   Benign  . CARDIAC CATHETERIZATION  09/12/05   Dr. Jenkins [place]   . COLONOSCOPY  2013   [facility] -polyps  . PACEMAKER INSERTION  09/13/05   [employer], dual chamber. Dr. Lovena Le   . PPM GENERATOR CHANGEOUT N/A 11/24/2017   Procedure: PPM GENERATOR CHANGEOUT;  Surgeon: Evans Lance, MD;  Location: [facility];  Service: Cardiovascular;  Laterality: N/A;  . TOTAL ABDOMINAL  HYSTERECTOMY W/ BILATERAL SALPINGOOPHORECTOMY    . WISDOM TOOTH EXTRACTION      Current Medications: Current Meds  Medication Sig  . acetaminophen (TYLENOL) 325 MG tablet Take 325 mg by mouth every 6 (six) hours as needed (for pain/headaches.).  Marland Kitchen aspirin EC 81 MG tablet Take 81 mg by mouth daily.  . Calcium Carb-Cholecalciferol (CALCIUM 500 + D3) 500-200 MG-UNIT TABS Take 1 tablet by mouth every other day.  . Cholecalciferol (VITAMIN D3) 2000 units TABS Take 2,000 Units by mouth daily.  . furosemide (LASIX) 40 MG tablet Take 1 tablet (40 mg total) by mouth daily.  Marland Kitchen glipiZIDE (GLUCOTROL XL) 10 MG 24 hr tablet Take 10 mg by mouth 2 (two) times daily.    Marland Kitchen lisinopril (PRINIVIL,ZESTRIL) 20 MG tablet Take 1 tablet (20 mg total) by mouth daily.  . meclizine (ANTIVERT) 25 MG tablet Take 25 mg by mouth 2 (two) times daily as needed for dizziness or nausea.   . metoprolol (LOPRESSOR) 50 MG tablet Take 50 mg by mouth 2 (two) times daily.    . Multiple Vitamin (MULTIVITAMIN WITH MINERALS) TABS tablet Take 1 tablet by mouth daily.  . pioglitazone (ACTOS) 15 MG tablet Take 15 mg by mouth daily.  . simvastatin (ZOCOR) 40 MG tablet Take 40 mg by mouth daily.   . sitaGLIPtan-metformin (JANUMET) 50-1000 MG per tablet Take 1  tablet by mouth 2 (two) times daily.    Current Facility-Administered Medications for the 01/15/18 encounter (Office Visit) with Imogene Burn, PA-C  Medication  . 0.9 %  sodium chloride infusion     Allergies:   Sulfa drugs cross reactors   Social History   Socioeconomic History  . Marital status: Married    Spouse name: None  . Number of children: 1  . Years of education: None  . Highest education level: None  Social Needs  . Financial resource strain: None  . Food insecurity - worry: None  . Food insecurity - inability: None  . Transportation needs - medical: None  . Transportation needs - non-medical: None  Occupational History  . Occupation: Retired from [employer]  store    Employer: RETIRED  Tobacco Use  . Smoking status: Never Smoker  . Smokeless tobacco: Never Used  . Tobacco comment: tobacco use - no  Substance and Sexual Activity  . Alcohol use: No  . Drug use: No  . Sexual activity: None    Comment: Hysterectomy  Other Topics Concern  . None  Social History Narrative   Lives in [place] with her husband.      Family History:  The patient's family history includes Diabetes in her father; Lung cancer in her father.   ROS:   Please see the history of present illness.    Review of Systems  Constitution: Negative.  HENT: Negative.   Eyes: Negative.   Cardiovascular: Negative.   Respiratory: Negative.   Hematologic/Lymphatic: Negative.   Musculoskeletal: Negative.  Negative for joint pain.       Foot cramps  Gastrointestinal: Negative.   Genitourinary: Negative.   Neurological: Negative.    All other systems reviewed and are negative.   PHYSICAL EXAM:   VS:  BP 122/86   Pulse 75   Ht 5' (1.524 m)   Wt 125 lb (56.7 kg)   SpO2 99%   BMI 24.41 kg/m   Physical Exam  GEN: Well nourished, well developed, in no acute distress  Neck: no JVD, carotid bruits, or masses Cardiac:RRR; positive S4 2/6 to 3/6 systolic murmur at the left sternal border Respiratory:  clear to auscultation bilaterally, normal work of breathing GI: soft, nontender, nondistended, + BS Ext: without cyanosis, clubbing, or edema, Good distal pulses bilaterally Neuro:  Alert and Oriented x 3 Psych: euthymic mood, full affect  Wt Readings from Last 3 Encounters:  01/15/18 125 lb (56.7 kg)  01/08/18 127 lb 12.8 oz (58 kg)  11/24/17 127 lb (57.6 kg)      Studies/Labs Reviewed:   EKG:  EKG is not ordered today.    Recent Labs: 11/13/2017: BUN 31; Creatinine, Ser 1.89; Hemoglobin 10.9; Platelets 297; Potassium 4.1; Sodium 142   Lipid Panel No results found for: CHOL, TRIG, HDL, CHOLHDL, VLDL, LDLCALC, LDLDIRECT  Additional studies/ records that were  reviewed today include:  2D echo 01/10/18   Study Conclusions   - Left ventricle: The cavity size was normal. Systolic function was   vigorous. The estimated ejection fraction was in the range of 65%   to 70%. Wall motion was normal; there were no regional wall   motion abnormalities. There was an increased relative   contribution of atrial contraction to ventricular filling.   Doppler parameters are consistent with abnormal left ventricular   relaxation (grade 1 diastolic dysfunction). Doppler parameters   are consistent with high ventricular filling pressure. - Mitral valve: There was trivial regurgitation. - Tricuspid  valve: There was mild regurgitation. - Pulmonary arteries: PA peak pressure: 59 mm Hg (S).   Impressions:   - The right ventricular systolic pressure was increased consistent   with moderate pulmonary hypertension.   -----------------------   ASSESSMENT:    1. Pulmonary hypertension, unspecified ([place])   2. PACEMAKER, PERMANENT   3. Essential hypertension   4. Mixed hyperlipidemia   5. Type 2 diabetes mellitus with other specified complication, unspecified whether long term insulin use (HCC)      PLAN:  In order of problems listed above:  Pulmonary hypertension with edema.  I stopped her Zestoretic and began Zestril 20 mg daily and Lasix 40 mg daily.  She is much better without edema.  We will check renal function today.  Last creatinine 1.89.  Can try to decrease Lasix if necessary.  Also having foot cramps so check potassium.  Permanent pacemaker with recent revision last month by Dr. Lovena Le.  Essential hypertension blood pressure well controlled  Mixed hyperlipidemia continue Zocor  Diabetes mellitus asking for a foot doctor.  Will refer to Dr. Trellis Paganini, podiatrist  Medication Adjustments/Labs and Tests Ordered: Current medicines are reviewed at length with the patient today.  Concerns regarding medicines are outlined above.  Medication changes,  Labs and Tests ordered today are listed in the Patient Instructions below. Patient Instructions  Medication Instructions:  Your physician recommends that you continue on your current medications as directed. Please refer to the Current Medication list given to you today.   Labwork: TODAY:  BMET  Testing/Procedures: None ordered  Follow-Up: Your physician recommends that you schedule a follow-up appointment in: [place]  You have been referred to Dr. Keenan Bachelor, Podiatrist     Any Other Special Instructions Will Be Listed Below (If Applicable).     If you need a refill on your cardiac medications before your next appointment, please call your pharmacy.      Sumner Boast, PA-C  01/15/2018 11:50 AM    [facility] [address], [place], [place]  [address] Phone: [phone]; Fax: [phone]

## 2018-01-16 ENCOUNTER — Telehealth: Payer: Self-pay

## 2018-01-16 DIAGNOSIS — I272 Pulmonary hypertension, unspecified: Secondary | ICD-10-CM

## 2018-01-16 DIAGNOSIS — I10 Essential (primary) hypertension: Secondary | ICD-10-CM

## 2018-01-16 LAB — BASIC METABOLIC PANEL
BUN / CREAT RATIO: 19 (ref 12–28)
BUN: 41 mg/dL — ABNORMAL HIGH (ref 8–27)
CHLORIDE: 102 mmol/L (ref 96–106)
CO2: 27 mmol/L (ref 20–29)
Calcium: 10.2 mg/dL (ref 8.7–10.3)
Creatinine, Ser: 2.18 mg/dL — ABNORMAL HIGH (ref 0.57–1.00)
GFR calc Af Amer: 24 mL/min/{1.73_m2} — ABNORMAL LOW (ref >59)
GFR calc non Af Amer: 21 mL/min/{1.73_m2} — ABNORMAL LOW (ref >59)
GLUCOSE: 213 mg/dL — AB (ref 65–99)
POTASSIUM: 4.5 mmol/L (ref 3.5–5.2)
SODIUM: 148 mmol/L — AB (ref 134–144)

## 2018-01-16 MED ORDER — FUROSEMIDE 40 MG PO TABS: 20.0000 mg | ORAL_TABLET | Freq: Every day | ORAL | 3 refills | Status: DC

## 2018-01-16 NOTE — Telephone Encounter (Signed)
Patient called an made aware of lab results and recommendations to decrease lasix to 20 mg QD. Will repeat BMET in 3 weeks on 3/19. Patient verbalized understanding and was in agreement with this plan.

## 2018-01-16 NOTE — Telephone Encounter (Signed)
-----  Message from Michele M Lenze, PA-C sent at 01/16/2018 10:38 AM EST ----- Potassium normal creatinine up a little bit.  Decrease Lasix to 20 mg once daily.  Recheck be met in 3 weeks 

## 2018-01-30 ENCOUNTER — Ambulatory Visit: Payer: Medicare Other | Admitting: Physician Assistant

## 2018-02-06 ENCOUNTER — Other Ambulatory Visit: Payer: Medicare Other | Admitting: *Deleted

## 2018-02-06 DIAGNOSIS — I272 Pulmonary hypertension, unspecified: Secondary | ICD-10-CM

## 2018-02-06 DIAGNOSIS — I10 Essential (primary) hypertension: Secondary | ICD-10-CM

## 2018-02-06 LAB — BASIC METABOLIC PANEL
BUN/Creatinine Ratio: 18 (ref 12–28)
BUN: 29 mg/dL — ABNORMAL HIGH (ref 8–27)
CO2: 24 mmol/L (ref 20–29)
Calcium: 10.4 mg/dL — ABNORMAL HIGH (ref 8.7–10.3)
Chloride: 105 mmol/L (ref 96–106)
Creatinine, Ser: 1.61 mg/dL — ABNORMAL HIGH (ref 0.57–1.00)
GFR, EST AFRICAN AMERICAN: 35 mL/min/{1.73_m2} — AB (ref >59)
GFR, EST NON AFRICAN AMERICAN: 30 mL/min/{1.73_m2} — AB (ref >59)
Glucose: 105 mg/dL — ABNORMAL HIGH (ref 65–99)
POTASSIUM: 4 mmol/L (ref 3.5–5.2)
Sodium: 144 mmol/L (ref 134–144)

## 2018-02-07 ENCOUNTER — Telehealth: Payer: Self-pay | Admitting: Internal Medicine

## 2018-02-07 NOTE — Telephone Encounter (Signed)
Mrs.Everman retuning a call about lab results . Thanks

## 2018-02-08 NOTE — Telephone Encounter (Signed)
Attempted outreach to Pt to notify her of her lab results.  Pt VM not set up, not accepting messages.  Forwarding to covering nurse.

## 2018-02-08 NOTE — Telephone Encounter (Signed)
Pt has already been made aware of her lab results. See result note.

## 2018-02-09 ENCOUNTER — Encounter: Payer: Self-pay | Admitting: Internal Medicine

## 2018-02-20 ENCOUNTER — Encounter: Payer: Self-pay | Admitting: Internal Medicine

## 2018-02-20 ENCOUNTER — Ambulatory Visit: Payer: Medicare Other | Admitting: Internal Medicine

## 2018-02-20 VITALS — BP 126/72 | HR 82 | Ht 60.0 in | Wt 124.0 lb

## 2018-02-20 DIAGNOSIS — I442 Atrioventricular block, complete: Secondary | ICD-10-CM | POA: Diagnosis not present

## 2018-02-20 DIAGNOSIS — I10 Essential (primary) hypertension: Secondary | ICD-10-CM

## 2018-02-20 DIAGNOSIS — Z950 Presence of cardiac pacemaker: Secondary | ICD-10-CM | POA: Diagnosis not present

## 2018-02-20 LAB — CUP PACEART INCLINIC DEVICE CHECK
Battery Remaining Longevity: 132 mo
Battery Voltage: 3.15 V
Brady Statistic AP VP Percent: 3.18 %
Brady Statistic AP VS Percent: 0 %
Brady Statistic AS VP Percent: 96.82 %
Brady Statistic AS VS Percent: 0 %
Brady Statistic RA Percent Paced: 3.17 %
Brady Statistic RV Percent Paced: 100 %
Date Time Interrogation Session: 20190402150013
Implantable Lead Implant Date: 20061024
Implantable Lead Implant Date: 20061024
Implantable Lead Location: 753859
Implantable Lead Location: 753860
Implantable Lead Model: 5076
Implantable Lead Model: 5076
Implantable Pulse Generator Implant Date: 20190104
Lead Channel Impedance Value: 247 Ohm
Lead Channel Impedance Value: 361 Ohm
Lead Channel Impedance Value: 399 Ohm
Lead Channel Impedance Value: 437 Ohm
Lead Channel Pacing Threshold Amplitude: 0.625 V
Lead Channel Pacing Threshold Amplitude: 0.75 V
Lead Channel Pacing Threshold Pulse Width: 0.4 ms
Lead Channel Pacing Threshold Pulse Width: 0.4 ms
Lead Channel Sensing Intrinsic Amplitude: 2.125 mV
Lead Channel Sensing Intrinsic Amplitude: 2.125 mV
Lead Channel Setting Pacing Amplitude: 1.5 V
Lead Channel Setting Pacing Amplitude: 2.5 V
Lead Channel Setting Pacing Pulse Width: 0.4 ms
Lead Channel Setting Sensing Sensitivity: 4 mV

## 2018-02-20 NOTE — Patient Instructions (Signed)

## 2018-02-20 NOTE — Progress Notes (Addendum)
HPI Mrs. Fisher returns today for followup of her PPM placed for CHB. She also has a h/o HTN, dyslipidemia, and underwent PPM gen change about 3 months ago. She admits to some dietary indiscretion and was given a low dose of lasix. She returns for additional evaluation. She feels well and denies chest pain or sob. Minimal gouty symptoms.  Allergies  Allergen Reactions  . Sulfa Drugs Cross Reactors     Swelling, blisters and itching     Current Outpatient Medications  Medication Sig Dispense Refill  . acetaminophen (TYLENOL) 325 MG tablet Take 325 mg by mouth every 6 (six) hours as needed (for pain/headaches.).    Marland Kitchen aspirin EC 81 MG tablet Take 81 mg by mouth daily.    . Calcium Carb-Cholecalciferol (CALCIUM 500 + D3) 500-200 MG-UNIT TABS Take 1 tablet by mouth every other day.    . Cholecalciferol (VITAMIN D3) 2000 units TABS Take 2,000 Units by mouth daily.    . furosemide (LASIX) 40 MG tablet Take 0.5 tablets (20 mg total) by mouth daily. 45 tablet 3  . glipiZIDE (GLUCOTROL XL) 10 MG 24 hr tablet Take 10 mg by mouth 2 (two) times daily.      Marland Kitchen lisinopril (PRINIVIL,ZESTRIL) 20 MG tablet Take 1 tablet (20 mg total) by mouth daily. 90 tablet 3  . meclizine (ANTIVERT) 25 MG tablet Take 25 mg by mouth 2 (two) times daily as needed for dizziness or nausea.     . metoprolol (LOPRESSOR) 50 MG tablet Take 50 mg by mouth 2 (two) times daily.      . Multiple Vitamin (MULTIVITAMIN WITH MINERALS) TABS tablet Take 1 tablet by mouth daily.    . pioglitazone (ACTOS) 15 MG tablet Take 15 mg by mouth daily.    . simvastatin (ZOCOR) 40 MG tablet Take 40 mg by mouth daily.     . sitaGLIPtan-metformin (JANUMET) 50-1000 MG per tablet Take 1 tablet by mouth 2 (two) times daily.      Current Facility-Administered Medications  Medication Dose Route Frequency Provider Last Rate Last Dose  . 0.9 %  sodium chloride infusion  500 mL Intravenous Continuous Ladene Artist, MD         Past Medical  History:  Diagnosis Date  . Atrioventricular block, complete ([place])   . Diverticulosis   . DM (diabetes mellitus) ([place])   . Hemorrhoids   . HTN (hypertension)   . Hyperlipidemia   . Osteoporosis   . Pacemaker   . Tubular adenoma of colon 03/2010    ROS:   All systems reviewed and negative except as noted in the HPI.   Past Surgical History:  Procedure Laterality Date  . BRAIN BIOPSY  1998   Benign  . CARDIAC CATHETERIZATION  09/12/05   Dr. Jenkins Rouge   . COLONOSCOPY  2013   Stark -polyps  . PACEMAKER INSERTION  09/13/05   Medtronic, dual chamber. Dr. Lovena Le   . PPM GENERATOR CHANGEOUT N/A 11/24/2017   Procedure: PPM GENERATOR CHANGEOUT;  Surgeon: Evans Lance, MD;  Location: [facility];  Service: Cardiovascular;  Laterality: N/A;  . TOTAL ABDOMINAL HYSTERECTOMY W/ BILATERAL SALPINGOOPHORECTOMY    . WISDOM TOOTH EXTRACTION       Family History  Problem Relation Age of Onset  . Diabetes Father   . Lung cancer Father   . Colon cancer Neg Hx   . Colon polyps Neg Hx   . Esophageal cancer Neg Hx   .  Rectal cancer Neg Hx   . Stomach cancer Neg Hx      Social History   Socioeconomic History  . Marital status: Married    Spouse name: Not on file  . Number of children: 1  . Years of education: Not on file  . Highest education level: Not on file  Occupational History  . Occupation: Retired from [employer] store    Employer: RETIRED  Social Needs  . Financial resource strain: Not on file  . Food insecurity:    Worry: Not on file    Inability: Not on file  . Transportation needs:    Medical: Not on file    Non-medical: Not on file  Tobacco Use  . Smoking status: Never Smoker  . Smokeless tobacco: Never Used  . Tobacco comment: tobacco use - no  Substance and Sexual Activity  . Alcohol use: No  . Drug use: No  . Sexual activity: Not on file    Comment: Hysterectomy  Lifestyle  . Physical activity:    Days per week: Not on file    Minutes per session:  Not on file  . Stress: Not on file  Relationships  . Social connections:    Talks on phone: Not on file    Gets together: Not on file    Attends religious service: Not on file    Active member of club or organization: Not on file    Attends meetings of clubs or organizations: Not on file    Relationship status: Not on file  . Intimate partner violence:    Fear of current or ex partner: Not on file    Emotionally abused: Not on file    Physically abused: Not on file    Forced sexual activity: Not on file  Other Topics Concern  . Not on file  Social History Narrative   Lives in [place] with her husband.      BP 126/72   Pulse 82   Ht 5' (1.524 m)   Wt 124 lb (56.2 kg)   BMI 24.22 kg/m   Physical Exam:  Well appearing 83 yo woman, NAD HEENT: Unremarkable Neck:  5 cm JVD, no thyromegally Lymphatics:  No adenopathy Back:  No CVA tenderness Lungs:  Clear with no wheezes HEART:  Regular rate rhythm, no murmurs, no rubs, no clicks Abd:  soft, positive bowel sounds, no organomegally, no rebound, no guarding Ext:  2 plus pulses, no edema, no cyanosis, no clubbing Skin:  No rashes no nodules Neuro:  CN II through XII intact, motor grossly intact  EKG - nsr with ventricular pacing  DEVICE  Normal device function.  See PaceArt for details.   Assess/Plan: 1. CHB - she is asymptomatic, s/p PPM insertion 2. PPM - her Medtronic DDD PPM is working normally. Will recheck in several months. 3. HTN - her blood pressure is well controlled. No change in meds.  Mikle Bosworth.D.

## 2018-05-22 ENCOUNTER — Ambulatory Visit (INDEPENDENT_AMBULATORY_CARE_PROVIDER_SITE_OTHER): Payer: Medicare Other | Admitting: *Deleted

## 2018-05-22 ENCOUNTER — Telehealth: Payer: Self-pay | Admitting: Cardiology

## 2018-05-22 DIAGNOSIS — I442 Atrioventricular block, complete: Secondary | ICD-10-CM | POA: Diagnosis not present

## 2018-05-22 NOTE — Telephone Encounter (Signed)
Spoke with pt and reminded pt of remote transmission that is due today. Pt verbalized understanding.   

## 2018-05-22 NOTE — Progress Notes (Signed)
Remote pacemaker transmission.   

## 2018-05-31 LAB — CUP PACEART REMOTE DEVICE CHECK
Battery Remaining Longevity: 129 mo
Battery Voltage: 3.1 V
Brady Statistic AP VP Percent: 5.34 %
Brady Statistic AP VS Percent: 0 %
Brady Statistic AS VS Percent: 0 %
Brady Statistic RV Percent Paced: 100 %
Implantable Lead Location: 753860
Implantable Lead Model: 5076
Implantable Pulse Generator Implant Date: 20190104
Lead Channel Impedance Value: 247 Ohm
Lead Channel Impedance Value: 342 Ohm
Lead Channel Impedance Value: 380 Ohm
Lead Channel Pacing Threshold Amplitude: 0.625 V
Lead Channel Pacing Threshold Amplitude: 0.75 V
Lead Channel Pacing Threshold Pulse Width: 0.4 ms
Lead Channel Sensing Intrinsic Amplitude: 2.25 mV
Lead Channel Setting Pacing Amplitude: 2.5 V
Lead Channel Setting Pacing Pulse Width: 0.4 ms
MDC IDC LEAD IMPLANT DT: 20061024
MDC IDC LEAD IMPLANT DT: 20061024
MDC IDC LEAD LOCATION: 753859
MDC IDC MSMT LEADCHNL RA PACING THRESHOLD PULSEWIDTH: 0.4 ms
MDC IDC MSMT LEADCHNL RA SENSING INTR AMPL: 2.25 mV
MDC IDC MSMT LEADCHNL RV IMPEDANCE VALUE: 437 Ohm
MDC IDC SESS DTM: 20190702170038
MDC IDC SET LEADCHNL RA PACING AMPLITUDE: 1.5 V
MDC IDC SET LEADCHNL RV SENSING SENSITIVITY: 4 mV
MDC IDC STAT BRADY AS VP PERCENT: 94.66 %
MDC IDC STAT BRADY RA PERCENT PACED: 5.32 %

## 2018-06-11 ENCOUNTER — Ambulatory Visit (INDEPENDENT_AMBULATORY_CARE_PROVIDER_SITE_OTHER): Payer: Medicare Other

## 2018-06-11 ENCOUNTER — Ambulatory Visit (HOSPITAL_COMMUNITY)
Admission: EM | Admit: 2018-06-11 | Discharge: 2018-06-11 | Disposition: A | Discharge status: Home / Self Care | Payer: Medicare Other | Attending: Family Medicine | Admitting: Family Medicine

## 2018-06-11 ENCOUNTER — Encounter (HOSPITAL_COMMUNITY): Payer: Self-pay | Admitting: Emergency Medicine

## 2018-06-11 DIAGNOSIS — M7989 Other specified soft tissue disorders: Secondary | ICD-10-CM

## 2018-06-11 DIAGNOSIS — M79671 Pain in right foot: Secondary | ICD-10-CM

## 2018-06-11 DIAGNOSIS — R2241 Localized swelling, mass and lump, right lower limb: Secondary | ICD-10-CM | POA: Diagnosis not present

## 2018-06-11 DIAGNOSIS — Z8739 Personal history of other diseases of the musculoskeletal system and connective tissue: Secondary | ICD-10-CM | POA: Diagnosis not present

## 2018-06-11 MED ORDER — NAPROXEN 375 MG PO TABS: 375.0000 mg | ORAL_TABLET | Freq: Two times a day (BID) | ORAL | 0 refills | Status: DC

## 2018-06-11 NOTE — ED Triage Notes (Signed)
Pt sts right foot pain and swelling x 1 week; pt sts hx of gout unsure if same

## 2018-06-11 NOTE — Discharge Instructions (Signed)
Wear ace wrap to help with swelling Elevate foot and ice multiple times a day Take naprosyn twice daily over the next week  Follow up if symptoms worsening, developing worsening, pain, redness

## 2018-06-11 NOTE — ED Provider Notes (Signed)
[facility]    CSN: [id number] Arrival date & time: 06/11/18  1157     History   Chief Complaint Chief Complaint  Patient presents with  . Foot Pain    HPI [name] is a 83 y.o. female history of DM type II, hypertension, hyperlipidemia, osteoporosis presenting today for evaluation of right foot pain and swelling.  Symptoms have been present for about 1 week.  She has a history of gout, she tried to take colchicine earlier this week which did not provide her with relief.  She is unsure of any injury or dropping anything on her foot.  Pain worsens with weightbearing and moving toes.  HPI  Past Medical History:  Diagnosis Date  . Atrioventricular block, complete ([place])   . Diverticulosis   . DM (diabetes mellitus) ([place])   . Hemorrhoids   . HTN (hypertension)   . Hyperlipidemia   . Osteoporosis   . Pacemaker   . Tubular adenoma of colon 03/2010    Patient Active Problem List   Diagnosis Date Noted  . Pulmonary hypertension, unspecified ([place]) 01/15/2018  . Gout 12/04/2015  . Leg cramps 11/29/2013  . DM 05/05/2009  . Hyperlipidemia 05/05/2009  . Essential hypertension 05/05/2009  . ATRIOVENTRICULAR BLOCK, COMPLETE 05/05/2009  . PACEMAKER, PERMANENT 05/05/2009    Past Surgical History:  Procedure Laterality Date  . BRAIN BIOPSY  1998   Benign  . CARDIAC CATHETERIZATION  09/12/05   Dr. Jenkins Rouge   . COLONOSCOPY  2013   [facility]ark -polyps  . PACEMAKER INSERTION  09/13/05   Medtronic, dual chamber. Dr. Lovena Le   . PPM GENERATOR CHANGEOUT N/A 11/24/2017   Procedure: PPM GENERATOR CHANGEOUT;  Surgeon: Evans Lance, MD;  Location: [facility];  Service: Cardiovascular;  Laterality: N/A;  . TOTAL ABDOMINAL HYSTERECTOMY W/ BILATERAL SALPINGOOPHORECTOMY    . WISDOM TOOTH EXTRACTION      OB History   None      Home Medications    Prior to Admission medications   Medication Sig Start Date End Date Taking? Authorizing Provider    acetaminophen (TYLENOL) 325 MG tablet Take 325 mg by mouth every 6 (six) hours as needed (for pain/headaches.).    [provider]  aspirin EC 81 MG tablet Take 81 mg by mouth daily.    [provider]  Calcium Carb-Cholecalciferol (CALCIUM 500 + D3) 500-200 MG-UNIT TABS Take 1 tablet by mouth every other day.    [provider]  Cholecalciferol (VITAMIN D3) 2000 units TABS Take 2,000 Units by mouth daily.    [provider]  furosemide (LASIX) 40 MG tablet Take 0.5 tablets (20 mg total) by mouth daily. 01/16/18   Imogene Burn, PA-C  glipiZIDE (GLUCOTROL XL) 10 MG 24 hr tablet Take 10 mg by mouth 2 (two) times daily.      [provider]  lisinopril (PRINIVIL,ZESTRIL) 20 MG tablet Take 1 tablet (20 mg total) by mouth daily. 01/11/18 04/11/18  Imogene Burn, PA-C  meclizine (ANTIVERT) 25 MG tablet Take 25 mg by mouth 2 (two) times daily as needed for dizziness or nausea.     [provider]  metoprolol (LOPRESSOR) 50 MG tablet Take 50 mg by mouth 2 (two) times daily.      [provider]  Multiple Vitamin (MULTIVITAMIN WITH MINERALS) TABS tablet Take 1 tablet by mouth daily.    [provider]  naproxen (NAPROSYN) 375 MG tablet Take 1 tablet (375 mg total) by  mouth 2 (two) times daily. 06/11/18   Ciana Simmon C, PA-C  pioglitazone (ACTOS) 15 MG tablet Take 15 mg by mouth daily.    [provider]  simvastatin (ZOCOR) 40 MG tablet Take 40 mg by mouth daily.     [provider]  sitaGLIPtan-metformin (JANUMET) 50-1000 MG per tablet Take 1 tablet by mouth 2 (two) times daily.     [provider]    Family History Family History  Problem Relation Age of Onset  . Diabetes Father   . Lung cancer Father   . Colon cancer Neg Hx   . Colon polyps Neg Hx   . Esophageal cancer Neg Hx   . Rectal cancer Neg Hx   . Stomach cancer Neg Hx     Social History Social History   Tobacco Use  . Smoking  status: Never Smoker  . Smokeless tobacco: Never Used  . Tobacco comment: tobacco use - no  Substance Use Topics  . Alcohol use: No  . Drug use: No     Allergies   Sulfa drugs cross reactors   Review of Systems Review of Systems  Constitutional: Negative for fatigue and fever.  HENT: Negative for congestion, sinus pressure and sore throat.   Eyes: Negative for photophobia, pain and visual disturbance.  Respiratory: Negative for cough and shortness of breath.   Cardiovascular: Negative for chest pain.  Gastrointestinal: Negative for abdominal pain, nausea and vomiting.  Genitourinary: Negative for decreased urine volume and hematuria.  Musculoskeletal: Positive for arthralgias, gait problem, joint swelling and myalgias. Negative for neck pain and neck stiffness.  Skin: Negative for color change and wound.  Neurological: Negative for dizziness, syncope, facial asymmetry, speech difficulty, weakness, light-headedness, numbness and headaches.     Physical Exam Triage Vital Signs ED Triage Vitals [06/11/18 1227]  Enc Vitals Group     BP 116/64     Pulse Rate 73     Resp 18     Temp 98.3 F (36.8 C)     Temp Source Oral     SpO2 100 %     Weight      Height      Head Circumference      Peak Flow      Pain Score      Pain Loc      Pain Edu?      Excl. in [place]?    No data found.  Updated Vital Signs BP 116/64 (BP Location: Left Arm)   Pulse 73   Temp 98.3 F (36.8 C) (Oral)   Resp 18   SpO2 100%   Visual Acuity Right Eye Distance:   Left Eye Distance:   Bilateral Distance:    Right Eye Near:   Left Eye Near:    Bilateral Near:     Physical Exam  Constitutional: She is oriented to person, place, and time. She appears well-developed and well-nourished.  No acute distress  HENT:  Head: Normocephalic and atraumatic.  Nose: Nose normal.  Eyes: Conjunctivae are normal.  Neck: Neck supple.  Cardiovascular: Normal rate.  Pulmonary/Chest: Effort normal. No  respiratory distress.  Abdominal: She exhibits no distension.  Musculoskeletal: Normal range of motion.  Right foot: Mild swelling below right second through fourth toe, tenderness to palpation at this area, dorsalis pedis 2+ bilaterally, swelling does not extend into ankle or calf, nontender palpation of calf  Neurological: She is alert and oriented to person, place, and time.  Skin: Skin is warm and  dry.  Psychiatric: She has a normal mood and affect.  Nursing note and vitals reviewed.    UC Treatments / Results  Labs (all labs ordered are listed, but only abnormal results are displayed) Labs Reviewed - No data to display  EKG None  Radiology Dg Foot Complete Right  Result Date: 06/11/2018 CLINICAL DATA:  Right foot pain, swelling EXAM: RIGHT FOOT COMPLETE - 3+ VIEW COMPARISON:  None. FINDINGS: Soft tissue swelling along the dorsum of the foot. No acute bony abnormality. Specifically, no fracture, subluxation, or dislocation. Early degenerative changes at the 1st MTP joint. IMPRESSION: No acute bony abnormality. Electronically Signed   By: Rolm Baptise M.D.   On: 06/11/2018 13:46    Procedures Procedures (including critical care time)  Medications Ordered in UC Medications - No data to display  Initial Impression / Assessment and Plan / UC Course  I have reviewed the triage vital signs and the nursing notes.  Pertinent labs & imaging results that were available during my care of the patient were reviewed by me and considered in my medical decision making (see chart for details).     X-ray negative for any fracture or bony abnormality.  Symptoms do not seem to correlate with gout or cellulitis, slightly increased warmth, but no erythema, appears neurovascularly intact, will provide Ace wrap, Naprosyn to use, ice and elevation.  Follow-up if symptoms not improving with the use of anti-inflammatories.  Return if developing worsening swelling, pain or developing redness.   Discussed strict return precautions. Patient verbalized understanding and is agreeable with plan.  Final Clinical Impressions(s) / UC Diagnoses   Final diagnoses:  Foot swelling     Discharge Instructions     Wear ace wrap to help with swelling Elevate foot and ice multiple times a day Take naprosyn twice daily over the next week  Follow up if symptoms worsening, developing worsening, pain, redness    ED Prescriptions    Medication Sig Dispense Auth. Provider   naproxen (NAPROSYN) 375 MG tablet Take 1 tablet (375 mg total) by mouth 2 (two) times daily. 30 tablet Shamarra Warda, [place] C, PA-C     Controlled Substance Prescriptions [place] Controlled Substance Registry consulted? Not Applicable   Janith Lima, [place] 06/11/18 1416

## 2018-07-09 NOTE — Progress Notes (Signed)
Cardiology Office Note Date:  07/11/2018  Patient ID:  [name], [place] 18-Oct-1935, MRN [id number] PCP:  Haywood Pao, MD  Electrophysiologist:  Dr. Lovena Le    Chief Complaint: edema  History of Present Illness: [name] is a 83 y.o. female with history of CHB w/PPM, HTN, HLD, DM, CKD (III)  I see that when she saw Gerrianne Scale in feb of this year she was c/o edema, described as dependent, she was getting extra salt in her diet.   2 g sodium diet compression stockings wer recommended, an echo was done 01/10/18.  She had normal LVEF 65-70% with grade 1 DD and increased right ventricular systolic pressures consistent with moderate pulmonary hypertension.  Her Zestoretic was changed to to Zestril 20 mg once daily and add Lasix 40 mg daily.  She comes in today to be seen for Dr. Lovena Le.  She last saw him in April this year, she was doing well on the low dose lasix, no changes were made.  She has noticed her ankles swelling again, more son in the last couple weeks.  No symptoms  She denies any CP, palpitations, no dizziness, near syncope or syncope.  She denies any rest SOB or DOE, no symptoms of PND or orthopnea.  She does not wear the support stockings though she had heard that was bad for people with DM.  She denies any exertional incapacities, mentions she of course has slowed over the years, turns 83 next week, but is able to all her ADLs without difficulty, does not formally exercise.  She states when she 1st gets up her ankles are normal without any swelling and as the day progresses, she notices it accumulates, some nights unable to get her shoes on comfortably.   Device information: MDT dual chamber PPM, implanted 09/13/05, gen change 11/24/17 PACER DEPENDENT   Past Medical History:  Diagnosis Date  . Atrioventricular block, complete ([place])   . Diverticulosis   . DM (diabetes mellitus) ([place])   . Hemorrhoids   . HTN (hypertension)   . Hyperlipidemia   . Osteoporosis    . Pacemaker   . Tubular adenoma of colon 03/2010    Past Surgical History:  Procedure Laterality Date  . BRAIN BIOPSY  1998   Benign  . CARDIAC CATHETERIZATION  09/12/05   Dr. Jenkins Rouge   . COLONOSCOPY  2013   Stark -polyps  . PACEMAKER INSERTION  09/13/05   Medtronic, dual chamber. Dr. Lovena Le   . PPM GENERATOR CHANGEOUT N/A 11/24/2017   Procedure: PPM GENERATOR CHANGEOUT;  Surgeon: Evans Lance, MD;  Location: [facility];  Service: Cardiovascular;  Laterality: N/A;  . TOTAL ABDOMINAL HYSTERECTOMY W/ BILATERAL SALPINGOOPHORECTOMY    . WISDOM TOOTH EXTRACTION      Current Outpatient Medications  Medication Sig Dispense Refill  . acetaminophen (TYLENOL) 325 MG tablet Take 325 mg by mouth every 6 (six) hours as needed (for pain/headaches.).    Marland Kitchen aspirin EC 81 MG tablet Take 81 mg by mouth daily.    . Calcium Carb-Cholecalciferol (CALCIUM 500 + D3) 500-200 MG-UNIT TABS Take 1 tablet by mouth every other day.    . Cholecalciferol (VITAMIN D3) 2000 units TABS Take 2,000 Units by mouth daily.    . furosemide (LASIX) 40 MG tablet Take 0.5 tablets (20 mg total) by mouth daily. 45 tablet 3  . glipiZIDE (GLUCOTROL XL) 10 MG 24 hr tablet Take 10 mg by mouth 2 (two) times daily.      Marland Kitchen  meclizine (ANTIVERT) 25 MG tablet Take 25 mg by mouth 2 (two) times daily as needed for dizziness or nausea.     . metoprolol (LOPRESSOR) 50 MG tablet Take 50 mg by mouth 2 (two) times daily.      . Multiple Vitamin (MULTIVITAMIN WITH MINERALS) TABS tablet Take 1 tablet by mouth daily.    . naproxen (NAPROSYN) 375 MG tablet Take 1 tablet (375 mg total) by mouth 2 (two) times daily. 30 tablet 0  . pioglitazone (ACTOS) 15 MG tablet Take 15 mg by mouth daily.    . simvastatin (ZOCOR) 40 MG tablet Take 40 mg by mouth daily.     . sitaGLIPtan-metformin (JANUMET) 50-1000 MG per tablet Take 1 tablet by mouth 2 (two) times daily.     Marland Kitchen lisinopril (PRINIVIL,ZESTRIL) 20 MG tablet Take 1 tablet (20 mg total)  by mouth daily. 90 tablet 3   Current Facility-Administered Medications  Medication Dose Route Frequency Provider Last Rate Last Dose  . 0.9 %  sodium chloride infusion  500 mL Intravenous Continuous Ladene Artist, MD        Allergies:   Sulfa drugs cross reactors   Social History:  The patient  reports that she has never smoked. She has never used smokeless tobacco. She reports that she does not drink alcohol or use drugs.   Family History:  The patient's family history includes Diabetes in her father; Lung cancer in her father.  ROS:  Please see the history of present illness.    All other systems are reviewed and otherwise negative.   PHYSICAL EXAM:  VS:  BP (!) 146/71   Pulse 63   Ht 5' (1.524 m)   Wt 126 lb (57.2 kg)   BMI 24.61 kg/m  BMI: Body mass index is 24.61 kg/m. Well nourished, well developed, in no acute distress  HEENT: normocephalic, atraumatic  Neck: no JVD, carotid bruits or masses Cardiac:  RRR; no significant murmurs, no rubs, or gallops Lungs:  CTA b/l, no wheezing, rhonchi or rales  Abd: soft, nontender MS: no deformity, age appropriate atrophy Ext: b/l ankle edema  Skin: warm and dry, no rash Neuro:  No gross deficits appreciated Psych: euthymic mood, full affect  PPM site is stable, no tethering or discomfort   EKG:  Not done today PPM interrogation done today and reviewed by myself:  Battery and lead measurements are good, pacer dependent, no episodes/observations   2D echo 01/10/18 Study Conclusions - Left ventricle: The cavity size was normal. Systolic function was vigorous. The estimated ejection fraction was in the range of 65% to 70%. Wall motion was normal; there were no regional wall motion abnormalities. There was an increased relative contribution of atrial contraction to ventricular filling. Doppler parameters are consistent with abnormal left ventricular relaxation (grade 1 diastolic dysfunction). Doppler  parameters are consistent with high ventricular filling pressure. - Mitral valve: There was trivial regurgitation. - Tricuspid valve: There was mild regurgitation. - Pulmonary arteries: PA peak pressure: 59 mm Hg (S). Impressions: - The right ventricular systolic pressure was increased consistent with moderate pulmonary hypertension.  Recent Labs: 11/13/2017: Hemoglobin 10.9; Platelets 297 02/06/2018: BUN 29; Creatinine, Ser 1.61; Potassium 4.0; Sodium 144  No results found for requested labs within last 8760 hours.   CrCl cannot be calculated (Patient's most recent lab result is older than the maximum 21 days allowed.).   Wt Readings from Last 3 Encounters:  07/11/18 126 lb (57.2 kg)  02/20/18 124 lb (56.2 kg)  01/15/18 125 lb (56.7 kg)     Other studies reviewed: Additional studies/records reviewed today include: summarized above  ASSESSMENT AND PLAN:  1. PPM     Intact function  2. HTN     No changes today  3. Edema     Dependent     Recommend doubling lsix to 20mg  BID for 2 days, she is very reluctant to do this given the urinary frequency and urgency with this, though we discussed is only 2 days     Also recommend that she start to wear support stockings during the day, and elevate her feet when seated.    Disposition: She had labs done 2 weeks ago with her PMD, will request the results, F/u with will get BMET in 10 days to f/u on her extra lasix.  Current medicines are reviewed at length with the patient today.  The patient did not have any concerns regarding medicines.  Venetia Night, PA-C 07/11/2018 2:41 PM     [address] [place] [address] [phone] (office)  [phone] (fax)

## 2018-07-11 ENCOUNTER — Ambulatory Visit: Payer: Medicare Other | Admitting: Physician Assistant

## 2018-07-11 VITALS — BP 146/71 | HR 63 | Ht 60.0 in | Wt 126.0 lb

## 2018-07-11 DIAGNOSIS — I442 Atrioventricular block, complete: Secondary | ICD-10-CM | POA: Diagnosis not present

## 2018-07-11 DIAGNOSIS — Z950 Presence of cardiac pacemaker: Secondary | ICD-10-CM | POA: Diagnosis not present

## 2018-07-11 DIAGNOSIS — R609 Edema, unspecified: Secondary | ICD-10-CM

## 2018-07-11 DIAGNOSIS — I10 Essential (primary) hypertension: Secondary | ICD-10-CM

## 2018-07-11 NOTE — Patient Instructions (Addendum)
Medication Instructions:   TAKE  FUROSEMIDE  20 MG  ( HALF TABLET OF 40 MG ) TWICE A DAY FOR TWO DAYS  THEN RESUME BACK TO 20 MG ONCE A DAY   If you need a refill on your cardiac medications before your next appointment, please call your pharmacy.  Labwork:  BMET IN 10 DAYS    Testing/Procedures: NONE ORDERED  TODAY    Follow-Up: Your physician wants you to follow-up in:  IN [place] will receive a reminder letter in the mail two months in advance. If you don't receive a letter, please call our office to schedule the follow-up appointment.    Any Other Special Instructions Will Be Listed Below (If Applicable).  YOU HAVE BEEN RECOMMENDED TO WEAR SUPPORT STOCKINGS                                                                                                                                                '

## 2018-07-20 ENCOUNTER — Other Ambulatory Visit: Payer: Medicare Other | Admitting: *Deleted

## 2018-07-20 ENCOUNTER — Encounter (INDEPENDENT_AMBULATORY_CARE_PROVIDER_SITE_OTHER): Payer: Self-pay

## 2018-07-20 DIAGNOSIS — I10 Essential (primary) hypertension: Secondary | ICD-10-CM

## 2018-07-20 LAB — BASIC METABOLIC PANEL
BUN/Creatinine Ratio: 13 (ref 12–28)
BUN: 25 mg/dL (ref 8–27)
CALCIUM: 10.3 mg/dL (ref 8.7–10.3)
CO2: 26 mmol/L (ref 20–29)
CREATININE: 1.87 mg/dL — AB (ref 0.57–1.00)
Chloride: 102 mmol/L (ref 96–106)
GFR calc Af Amer: 29 mL/min/{1.73_m2} — ABNORMAL LOW (ref >59)
GFR calc non Af Amer: 25 mL/min/{1.73_m2} — ABNORMAL LOW (ref >59)
GLUCOSE: 123 mg/dL — AB (ref 65–99)
Potassium: 4.5 mmol/L (ref 3.5–5.2)
Sodium: 142 mmol/L (ref 134–144)

## 2018-08-21 ENCOUNTER — Ambulatory Visit (INDEPENDENT_AMBULATORY_CARE_PROVIDER_SITE_OTHER): Payer: Medicare Other | Admitting: *Deleted

## 2018-08-21 ENCOUNTER — Telehealth: Payer: Self-pay | Admitting: Cardiology

## 2018-08-21 DIAGNOSIS — I442 Atrioventricular block, complete: Secondary | ICD-10-CM

## 2018-08-21 NOTE — Telephone Encounter (Signed)
Spoke with pt and reminded pt of remote transmission that is due today. Pt verbalized understanding.   

## 2018-08-22 NOTE — Progress Notes (Signed)
Remote pacemaker transmission.   

## 2018-08-24 LAB — CUP PACEART REMOTE DEVICE CHECK
Battery Voltage: 3.04 V
Brady Statistic AS VP Percent: 95.02 %
Brady Statistic RA Percent Paced: 4.96 %
Brady Statistic RV Percent Paced: 100 %
Date Time Interrogation Session: 20191001193928
Implantable Lead Implant Date: 20061024
Implantable Lead Model: 5076
Implantable Pulse Generator Implant Date: 20190104
Lead Channel Impedance Value: 342 Ohm
Lead Channel Impedance Value: 456 Ohm
Lead Channel Pacing Threshold Amplitude: 0.625 V
Lead Channel Pacing Threshold Pulse Width: 0.4 ms
Lead Channel Sensing Intrinsic Amplitude: 2.125 mV
Lead Channel Sensing Intrinsic Amplitude: 2.125 mV
Lead Channel Setting Sensing Sensitivity: 4 mV
MDC IDC LEAD IMPLANT DT: 20061024
MDC IDC LEAD LOCATION: 753859
MDC IDC LEAD LOCATION: 753860
MDC IDC MSMT BATTERY REMAINING LONGEVITY: 127 mo
MDC IDC MSMT LEADCHNL RA IMPEDANCE VALUE: 247 Ohm
MDC IDC MSMT LEADCHNL RV IMPEDANCE VALUE: 380 Ohm
MDC IDC MSMT LEADCHNL RV PACING THRESHOLD AMPLITUDE: 0.625 V
MDC IDC MSMT LEADCHNL RV PACING THRESHOLD PULSEWIDTH: 0.4 ms
MDC IDC SET LEADCHNL RA PACING AMPLITUDE: 1.5 V
MDC IDC SET LEADCHNL RV PACING AMPLITUDE: 2.5 V
MDC IDC SET LEADCHNL RV PACING PULSEWIDTH: 0.4 ms
MDC IDC STAT BRADY AP VP PERCENT: 4.98 %
MDC IDC STAT BRADY AP VS PERCENT: 0 %
MDC IDC STAT BRADY AS VS PERCENT: 0 %

## 2018-09-07 ENCOUNTER — Other Ambulatory Visit: Payer: Self-pay | Admitting: Internal Medicine

## 2018-09-07 DIAGNOSIS — Z1231 Encounter for screening mammogram for malignant neoplasm of breast: Secondary | ICD-10-CM

## 2018-10-15 ENCOUNTER — Ambulatory Visit
Admission: RE | Admit: 2018-10-15 | Discharge: 2018-10-15 | Disposition: A | Discharge status: Home / Self Care | Payer: Medicare Other | Source: Ambulatory Visit | Attending: Internal Medicine | Admitting: Internal Medicine

## 2018-10-15 DIAGNOSIS — Z1231 Encounter for screening mammogram for malignant neoplasm of breast: Secondary | ICD-10-CM

## 2018-11-11 ENCOUNTER — Other Ambulatory Visit: Payer: Self-pay

## 2018-11-11 ENCOUNTER — Encounter (HOSPITAL_COMMUNITY): Payer: Self-pay

## 2018-11-11 ENCOUNTER — Emergency Department (HOSPITAL_COMMUNITY): Payer: Medicare Other

## 2018-11-11 ENCOUNTER — Emergency Department (HOSPITAL_COMMUNITY)
Admission: EM | Admit: 2018-11-11 | Discharge: 2018-11-11 | Disposition: A | Discharge status: Home / Self Care | Payer: Medicare Other | Attending: Emergency Medicine | Admitting: Emergency Medicine

## 2018-11-11 DIAGNOSIS — K567 Ileus, unspecified: Secondary | ICD-10-CM

## 2018-11-11 DIAGNOSIS — I10 Essential (primary) hypertension: Secondary | ICD-10-CM | POA: Insufficient documentation

## 2018-11-11 DIAGNOSIS — E119 Type 2 diabetes mellitus without complications: Secondary | ICD-10-CM | POA: Diagnosis not present

## 2018-11-11 DIAGNOSIS — Z7982 Long term (current) use of aspirin: Secondary | ICD-10-CM | POA: Diagnosis not present

## 2018-11-11 DIAGNOSIS — K529 Noninfective gastroenteritis and colitis, unspecified: Secondary | ICD-10-CM

## 2018-11-11 DIAGNOSIS — Z79899 Other long term (current) drug therapy: Secondary | ICD-10-CM | POA: Diagnosis not present

## 2018-11-11 DIAGNOSIS — R1013 Epigastric pain: Secondary | ICD-10-CM

## 2018-11-11 DIAGNOSIS — R109 Unspecified abdominal pain: Secondary | ICD-10-CM | POA: Diagnosis present

## 2018-11-11 LAB — COMPREHENSIVE METABOLIC PANEL
ALT: 8 U/L (ref 0–44)
AST: 18 U/L (ref 15–41)
Albumin: 3.9 g/dL (ref 3.5–5.0)
Alkaline Phosphatase: 41 U/L (ref 38–126)
Anion gap: 13 (ref 5–15)
BUN: 16 mg/dL (ref 8–23)
CO2: 27 mmol/L (ref 22–32)
Calcium: 10.2 mg/dL (ref 8.9–10.3)
Chloride: 104 mmol/L (ref 98–111)
Creatinine, Ser: 1.41 mg/dL — ABNORMAL HIGH (ref 0.44–1.00)
GFR calc Af Amer: 40 mL/min — ABNORMAL LOW (ref >60)
GFR calc non Af Amer: 35 mL/min — ABNORMAL LOW (ref >60)
GLUCOSE: 155 mg/dL — AB (ref 70–99)
Potassium: 3.8 mmol/L (ref 3.5–5.1)
SODIUM: 144 mmol/L (ref 135–145)
Total Bilirubin: 0.5 mg/dL (ref 0.3–1.2)
Total Protein: 7 g/dL (ref 6.5–8.1)

## 2018-11-11 LAB — URINALYSIS, ROUTINE W REFLEX MICROSCOPIC
Bilirubin Urine: NEGATIVE
Glucose, UA: NEGATIVE mg/dL
Hgb urine dipstick: NEGATIVE
Ketones, ur: 15 mg/dL — AB
Leukocytes, UA: NEGATIVE
Nitrite: NEGATIVE
Protein, ur: NEGATIVE mg/dL
Specific Gravity, Urine: <1.005 — ABNORMAL LOW (ref 1.005–1.030)
pH: 7.5 (ref 5.0–8.0)

## 2018-11-11 LAB — CBC WITH DIFFERENTIAL/PLATELET
Abs Immature Granulocytes: 0.06 10*3/uL (ref 0.00–0.07)
Basophils Absolute: 0 10*3/uL (ref 0.0–0.1)
Basophils Relative: 0 %
Eosinophils Absolute: 0 10*3/uL (ref 0.0–0.5)
Eosinophils Relative: 0 %
HCT: 32 % — ABNORMAL LOW (ref 36.0–46.0)
Hemoglobin: 9.8 g/dL — ABNORMAL LOW (ref 12.0–15.0)
Immature Granulocytes: 1 %
Lymphocytes Relative: 18 %
Lymphs Abs: 2 10*3/uL (ref 0.7–4.0)
MCH: 29 pg (ref 26.0–34.0)
MCHC: 30.6 g/dL (ref 30.0–36.0)
MCV: 94.7 fL (ref 80.0–100.0)
Monocytes Absolute: 0.8 10*3/uL (ref 0.1–1.0)
Monocytes Relative: 7 %
NEUTROS ABS: 8 10*3/uL — AB (ref 1.7–7.7)
Neutrophils Relative %: 74 %
Platelets: 268 10*3/uL (ref 150–400)
RBC: 3.38 MIL/uL — ABNORMAL LOW (ref 3.87–5.11)
RDW: 14.4 % (ref 11.5–15.5)
WBC: 10.8 10*3/uL — ABNORMAL HIGH (ref 4.0–10.5)
nRBC: 0 % (ref 0.0–0.2)

## 2018-11-11 LAB — I-STAT TROPONIN, ED: Troponin i, poc: 0 ng/mL (ref 0.00–0.08)

## 2018-11-11 LAB — LIPASE, BLOOD: Lipase: 34 U/L (ref 11–51)

## 2018-11-11 MED ORDER — IOHEXOL 300 MG/ML  SOLN
75.0000 mL | Freq: Once | INTRAMUSCULAR | Status: AC | PRN
  Administered 2018-11-11: 75 mL via INTRAVENOUS

## 2018-11-11 MED ORDER — SODIUM CHLORIDE 0.9 % IV BOLUS
1000.0000 mL | Freq: Once | INTRAVENOUS | Status: AC
  Administered 2018-11-11: 1000 mL via INTRAVENOUS

## 2018-11-11 NOTE — ED Provider Notes (Signed)
[facility] EMERGENCY DEPARTMENT Provider Note   CSN: [id number] Arrival date & time: 11/11/18  1317   History   Chief Complaint Chief Complaint  Patient presents with  . Abdominal Pain    HPI [name] is a 83 y.o. female.  This is a 83 year old female with a history of DM, HTN, HLD, an complete AV with pacemaker who presented after an episode of abdominal pian and NBNB emesis that started last night. She reports that after eating she had 2 episodes of emesis last ngiht, she took some alka-seltzer and burped which helped the pain. This morning after eating some toast she started having some abdominal pain again, it lasted for about 15 minutes and resolved with alka-seltzer.  She denies any other symptoms such as fevers, chills, diarrhea, constipation, headache, dysuria, change in frequency, weakness, edema, shortness of breath.  She has no known sick contacts, no changes in her diet recently, and cooks at home.  She reports that she has not had any changes to her medications recently. She has had a hysterectomy and a c-section in the past, has never had any abdominal complications from this.      Past Medical History:  Diagnosis Date  . Atrioventricular block, complete ([place])   . Diverticulosis   . DM (diabetes mellitus) ([place])   . Hemorrhoids   . HTN (hypertension)   . Hyperlipidemia   . Osteoporosis   . Pacemaker   . Tubular adenoma of colon 03/2010    Patient Active Problem List   Diagnosis Date Noted  . Pulmonary hypertension, unspecified ([place]) 01/15/2018  . Gout 12/04/2015  . Leg cramps 11/29/2013  . DM 05/05/2009  . Hyperlipidemia 05/05/2009  . Essential hypertension 05/05/2009  . ATRIOVENTRICULAR BLOCK, COMPLETE 05/05/2009  . PACEMAKER, PERMANENT 05/05/2009    Past Surgical History:  Procedure Laterality Date  . BRAIN BIOPSY  1998   Benign  . CARDIAC CATHETERIZATION  09/12/05   Dr. Jenkins Rouge   . COLONOSCOPY  2013   Stark  -polyps  . PACEMAKER INSERTION  09/13/05   Medtronic, dual chamber. Dr. Lovena Le   . PPM GENERATOR CHANGEOUT N/A 11/24/2017   Procedure: PPM GENERATOR CHANGEOUT;  Surgeon: Evans Lance, MD;  Location: [facility];  Service: Cardiovascular;  Laterality: N/A;  . TOTAL ABDOMINAL HYSTERECTOMY W/ BILATERAL SALPINGOOPHORECTOMY    . WISDOM TOOTH EXTRACTION       OB History   No obstetric history on file.      Home Medications    Prior to Admission medications   Medication Sig Start Date End Date Taking? Authorizing Provider  acetaminophen (TYLENOL) 325 MG tablet Take 325 mg by mouth every 6 (six) hours as needed (for pain/headaches.).   Yes [provider]  aspirin EC 81 MG tablet Take 81 mg by mouth daily.   Yes [provider]  Calcium Carb-Cholecalciferol (CALCIUM 500 + D3) 500-200 MG-UNIT TABS Take 1 tablet by mouth every other day.   Yes [provider]  Cholecalciferol (VITAMIN D3) 2000 units TABS Take 2,000 Units by mouth daily.   Yes [provider]  furosemide (LASIX) 40 MG tablet Take 0.5 tablets (20 mg total) by mouth daily. 01/16/18  Yes Imogene Burn, PA-C  glipiZIDE (GLUCOTROL XL) 10 MG 24 hr tablet Take 10 mg by mouth 2 (two) times daily.     Yes [provider]  lisinopril (PRINIVIL,ZESTRIL) 20 MG tablet Take 1 tablet (20 mg total) by mouth daily.  01/11/18 11/11/18 Yes Imogene Burn, PA-C  meclizine (ANTIVERT) 25 MG tablet Take 25 mg by mouth 2 (two) times daily as needed for dizziness or nausea.    Yes [provider]  metoprolol (LOPRESSOR) 50 MG tablet Take 50 mg by mouth 2 (two) times daily.     Yes [provider]  Multiple Vitamin (MULTIVITAMIN WITH MINERALS) TABS tablet Take 1 tablet by mouth daily.   Yes [provider]  naproxen (NAPROSYN) 375 MG tablet Take 1 tablet (375 mg total) by mouth 2 (two) times daily. 06/11/18  Yes Wieters, Hallie C, PA-C  pioglitazone (ACTOS) 15 MG tablet Take  15 mg by mouth daily.   Yes [provider]  simvastatin (ZOCOR) 40 MG tablet Take 40 mg by mouth daily.    Yes [provider]  sitaGLIPtan-metformin (JANUMET) 50-1000 MG per tablet Take 1 tablet by mouth 2 (two) times daily.    Yes [provider]    Family History Family History  Problem Relation Age of Onset  . Diabetes Father   . Lung cancer Father   . Colon cancer Neg Hx   . Colon polyps Neg Hx   . Esophageal cancer Neg Hx   . Rectal cancer Neg Hx   . Stomach cancer Neg Hx   . Breast cancer Neg Hx     Social History Social History   Tobacco Use  . Smoking status: Never Smoker  . Smokeless tobacco: Never Used  . Tobacco comment: tobacco use - no  Substance Use Topics  . Alcohol use: No  . Drug use: No     Allergies   Sulfa drugs cross reactors   Review of Systems Review of Systems  Constitutional: Positive for unexpected weight change. Negative for appetite change, chills and fever.  Respiratory: Negative for cough, shortness of breath and wheezing.   Cardiovascular: Negative for chest pain and palpitations.  Gastrointestinal: Positive for abdominal pain, constipation, nausea and vomiting. Negative for diarrhea.  Genitourinary: Positive for urgency. Negative for dysuria, flank pain, frequency and hematuria.  Musculoskeletal: Negative for back pain.  Neurological: Negative for dizziness, syncope, weakness and light-headedness.  All other systems reviewed and are negative.    Physical Exam Updated Vital Signs BP (!) 158/65 (BP Location: Right Arm)   Pulse 93   Temp 98.2 F (36.8 C) (Oral)   Resp 18   SpO2 98%   Physical Exam Constitutional:      Appearance: She is well-developed.  HENT:     Head: Normocephalic and atraumatic.     Mouth/Throat:     Mouth: Mucous membranes are moist.     Pharynx: Oropharynx is clear.  Eyes:     Extraocular Movements: Extraocular movements intact.     Pupils: Pupils are equal, round, and  reactive to light.  Cardiovascular:     Rate and Rhythm: Normal rate and regular rhythm.     Heart sounds: Normal heart sounds.  Pulmonary:     Effort: Pulmonary effort is normal.     Breath sounds: Normal breath sounds.  Abdominal:     General: Abdomen is flat and scaphoid. Bowel sounds are normal. There is distension (mild).     Palpations: Abdomen is soft.     Tenderness: There is no abdominal tenderness.  Skin:    General: Skin is warm and dry.     Capillary Refill: Capillary refill takes less than 2 seconds.  Neurological:     General: No focal deficit present.  Mental Status: She is alert and oriented to person, place, and time.  Psychiatric:        Mood and Affect: Mood normal.        Behavior: Behavior normal.      ED Treatments / Results  Labs (all labs ordered are listed, but only abnormal results are displayed) Labs Reviewed  CBC WITH DIFFERENTIAL/PLATELET - Abnormal; Notable for the following components:      Result Value   WBC 10.8 (*)    RBC 3.38 (*)    Hemoglobin 9.8 (*)    HCT 32.0 (*)    Neutro Abs 8.0 (*)    All other components within normal limits  COMPREHENSIVE METABOLIC PANEL - Abnormal; Notable for the following components:   Glucose, Bld 155 (*)    Creatinine, Ser 1.41 (*)    GFR calc non Af Amer 35 (*)    GFR calc Af Amer 40 (*)    All other components within normal limits  URINE CULTURE  LIPASE, BLOOD  URINALYSIS, ROUTINE W REFLEX MICROSCOPIC  I-STAT TROPONIN, ED    EKG EKG Interpretation  Date/Time:  Sunday November 11 2018 15:05:42 EST Ventricular Rate:  70 PR Interval:    QRS Duration: 121 QT Interval:  430 QTC Calculation: 464 R Axis:   -35 Text Interpretation:  Sinus rhythm Left bundle branch block No significant change since last tracing Confirmed by Gareth Morgan [phone]) on 11/11/2018 3:42:35 PM   Radiology No results found.  Procedures Procedures (including critical care time)  Medications Ordered in  ED Medications  sodium chloride 0.9 % bolus 1,000 mL (1,000 mLs Intravenous New Bag/Given 11/11/18 1511)  iohexol (OMNIPAQUE) 300 MG/ML solution 75 mL (75 mLs Intravenous Contrast Given 11/11/18 1612)     Initial Impression / Assessment and Plan / ED Course  I have reviewed the triage vital signs and the nursing notes.  Pertinent labs & imaging results that were available during my care of the patient were reviewed by me and considered in my medical decision making (see chart for details).     This is a 83 year old female with a history of DM, HTN, HLD, an complete AV with pacemaker who presented after an episode of abdominal pain and NBNB emesis that started last night. She reports that the pain has resolved. She is also reporting some constipation, Her vitals were WNL. On exam she does not have any abdominal pain, she had some mild distension, cardiac and pulmonary exam were WNL. Less suspicion for abdominal infection such as pancreatitis, appendicitis, or diverticulitis. She has had some mild constipation, had a bowel movemetn this morning but reported that it was small. It's possible that she has a possible bowel obstruction. CBC and CMP were unremarkable. Troponin was negative. EKG showed a left bundle branch block.   This appears to be a viral gastroenteritis however will need to rule out urinary infection or partial bowel obstruction. Urinalysis and CT scan pending, if this comes back negative she can be discharged home. Signed out to oncoming team.   Final Clinical Impressions(s) / ED Diagnoses   Final diagnoses:  Gastroenteritis  Epigastric pain    ED Discharge Orders    None       Asencion Noble, MD 11/11/18 0981    Gareth Morgan, MD 11/13/18 1420

## 2018-11-11 NOTE — ED Notes (Signed)
Up to br with assistance

## 2018-11-11 NOTE — ED Provider Notes (Signed)
7:05 PM-checkout from preceding team to evaluate after CT imaging for problems.  At this time patient is thirsty and hungry, and would like to try eating and drinking.  She states she has been somewhat constipated recently.  Last bowel movement was yesterday, small amount.  At this time abdomen is not distended, it is soft and nontender to palpation.  She is offered oral liquids, and will reassess.  7:27 PM-she is tolerating oral liquids without pain or vomiting.  Denies nausea.  Discussed with patient, and husband, all questions answered   Patient Vitals for the past 24 hrs:  BP Temp Temp src Pulse Resp SpO2  11/11/18 1715 (!) 154/69 - - 75 19 99 %  11/11/18 1700 (!) 156/75 - - 82 17 100 %  11/11/18 1630 (!) 151/80 - - - (!) 22 -  11/11/18 1615 (!) 152/88 - - - 17 -  11/11/18 1545 (!) 159/54 - - 76 20 100 %  11/11/18 1515 (!) 170/67 - - (!) 59 18 96 %  11/11/18 1320 (!) 158/65 98.2 F (36.8 C) Oral 93 18 98 %      Medical Decision Making: Evaluation consistent with ileus.  Doubt small bowel obstruction.  Doubt serious bacterial infection or metabolic instability.  CRITICAL CARE-no Performed by: Daleen Bo   Nursing Notes Reviewed/ Care Coordinated Applicable Imaging Reviewed Interpretation of Laboratory Data incorporated into ED treatment  The patient appears reasonably screened and/or stabilized for discharge and I doubt any other medical condition or other [facility] requiring further screening, evaluation, or treatment in the ED at this time prior to discharge.  Plan: Home Medications-continue usual medications; Home Treatments-gradually advance diet; return here if the recommended treatment, does not improve the symptoms; Recommended follow up-PCP or return here as needed.        Daleen Bo, MD 11/11/18 [phone]

## 2018-11-11 NOTE — Discharge Instructions (Addendum)
You appear to have an ileus which means your intestinal tract is moving slowly.  Tonight start with a clear liquid diet.  Tomorrow you can start some bland and soft foods and gradually increase your oral intake.  If your symptoms worsen, return here.  See your doctor if you are not completely better in 1 week.

## 2018-11-11 NOTE — ED Triage Notes (Signed)
Pt reports abdominal pain that started last night, she reports vomiting. Skin warm and dry.

## 2018-11-11 NOTE — ED Notes (Signed)
Pt to c-tand returened

## 2018-11-12 LAB — URINE CULTURE: Culture: 10000 — AB

## 2018-11-20 ENCOUNTER — Ambulatory Visit (INDEPENDENT_AMBULATORY_CARE_PROVIDER_SITE_OTHER): Payer: Medicare Other

## 2018-11-20 ENCOUNTER — Telehealth: Payer: Self-pay | Admitting: Internal Medicine

## 2018-11-20 DIAGNOSIS — I442 Atrioventricular block, complete: Secondary | ICD-10-CM

## 2018-11-20 LAB — CUP PACEART REMOTE DEVICE CHECK
Battery Remaining Longevity: 122 mo
Battery Voltage: 3.03 V
Brady Statistic AP VP Percent: 12.19 %
Brady Statistic AP VS Percent: 0 %
Brady Statistic AS VP Percent: 87.81 %
Brady Statistic AS VS Percent: 0 %
Brady Statistic RA Percent Paced: 12.17 %
Brady Statistic RV Percent Paced: 100 %
Date Time Interrogation Session: 20191231190514
Implantable Lead Implant Date: 20061024
Implantable Lead Implant Date: 20061024
Implantable Lead Location: 753859
Implantable Lead Model: 5076
Implantable Lead Model: 5076
Implantable Pulse Generator Implant Date: 20190104
Lead Channel Impedance Value: 361 Ohm
Lead Channel Impedance Value: 418 Ohm
Lead Channel Pacing Threshold Amplitude: 0.75 V
Lead Channel Pacing Threshold Amplitude: 0.75 V
Lead Channel Pacing Threshold Pulse Width: 0.4 ms
Lead Channel Sensing Intrinsic Amplitude: 2 mV
Lead Channel Sensing Intrinsic Amplitude: 2 mV
Lead Channel Setting Pacing Amplitude: 1.5 V
Lead Channel Setting Pacing Amplitude: 2.5 V
Lead Channel Setting Pacing Pulse Width: 0.4 ms
Lead Channel Setting Sensing Sensitivity: 4 mV
MDC IDC LEAD LOCATION: 753860
MDC IDC MSMT LEADCHNL RA IMPEDANCE VALUE: 228 Ohm
MDC IDC MSMT LEADCHNL RA IMPEDANCE VALUE: 342 Ohm
MDC IDC MSMT LEADCHNL RV PACING THRESHOLD PULSEWIDTH: 0.4 ms

## 2018-11-20 NOTE — Progress Notes (Signed)
Remote pacemaker transmission.   

## 2018-11-20 NOTE — Telephone Encounter (Signed)
Spoke with patient to remind of missed remote transmission 

## 2019-01-02 ENCOUNTER — Other Ambulatory Visit: Payer: Self-pay | Admitting: Physician Assistant

## 2019-01-05 NOTE — Progress Notes (Signed)
Electrophysiology Office Note Date: 01/08/2019  ID:  Barbara, Valentine [birth year], MRN [id number]  PCP: Barbara Pao, MD Electrophysiologist: [name]  CC: Pacemaker follow-up  [name] is a 84 y.o. female seen today for Dr [name].  She presents today for routine electrophysiology followup.  Since last being seen in our clinic, the patient reports doing very well.  She denies chest pain, palpitations, dyspnea, PND, orthopnea, nausea, vomiting, dizziness, syncope, edema, weight gain, or early satiety.  Device History: MDT dual chamber PPM implanted 2006 for complete heart block; gen change 2019   Past Medical History:  Diagnosis Date  . Atrioventricular block, complete ([place])   . Diverticulosis   . DM (diabetes mellitus) ([place])   . Hemorrhoids   . HTN (hypertension)   . Hyperlipidemia   . Osteoporosis   . Pacemaker   . Tubular adenoma of colon 03/2010   Past Surgical History:  Procedure Laterality Date  . BRAIN BIOPSY  1998   Benign  . CARDIAC CATHETERIZATION  09/12/05   Dr. Jenkins Rouge   . COLONOSCOPY  2013   Stark -polyps  . PACEMAKER INSERTION  09/13/05   Medtronic, dual chamber. Dr. Lovena Valentine   . PPM GENERATOR CHANGEOUT N/A 11/24/2017   Procedure: PPM GENERATOR CHANGEOUT;  Surgeon: Barbara Lance, MD;  Location: [facility];  Service: Cardiovascular;  Laterality: N/A;  . TOTAL ABDOMINAL HYSTERECTOMY W/ BILATERAL SALPINGOOPHORECTOMY    . WISDOM TOOTH EXTRACTION      Current Outpatient Medications  Medication Sig Dispense Refill  . acetaminophen (TYLENOL) 325 MG tablet Take 325 mg by mouth every 6 (six) hours as needed (for pain/headaches.).    Marland Kitchen aspirin EC 81 MG tablet Take 81 mg by mouth daily.    . Calcium Carb-Cholecalciferol (CALCIUM 500 + D3) 500-200 MG-UNIT TABS Take 1 tablet by mouth every other day.    . Cholecalciferol (VITAMIN D3) 2000 units TABS Take 2,000 Units by mouth daily.    . furosemide (LASIX) 40 MG tablet Take 0.5 tablets  (20 mg total) by mouth daily. 45 tablet 3  . glipiZIDE (GLUCOTROL XL) 10 MG 24 hr tablet Take 10 mg by mouth 2 (two) times daily.      Marland Kitchen lisinopril (PRINIVIL,ZESTRIL) 20 MG tablet TAKE 1 TABLET BY MOUTH EVERY DAY 90 tablet 2  . meclizine (ANTIVERT) 25 MG tablet Take 25 mg by mouth 2 (two) times daily as needed for dizziness or nausea.     . metoprolol (LOPRESSOR) 50 MG tablet Take 50 mg by mouth 2 (two) times daily.      . Multiple Vitamin (MULTIVITAMIN WITH MINERALS) TABS tablet Take 1 tablet by mouth daily.    . naproxen (NAPROSYN) 375 MG tablet Take 1 tablet (375 mg total) by mouth 2 (two) times daily. 30 tablet 0  . pioglitazone (ACTOS) 15 MG tablet Take 15 mg by mouth daily.    . simvastatin (ZOCOR) 40 MG tablet Take 40 mg by mouth daily.     . sitaGLIPtan-metformin (JANUMET) 50-1000 MG per tablet Take 1 tablet by mouth 2 (two) times daily.      Current Facility-Administered Medications  Medication Dose Route Frequency Provider Last Rate Last Dose  . 0.9 %  sodium chloride infusion  500 mL Intravenous Continuous Ladene Artist, MD        Allergies:   Sulfa drugs cross reactors   Social History: Social History   Socioeconomic History  . Marital status: Married    Spouse  name: Not on file  . Number of children: 1  . Years of education: Not on file  . Highest education level: Not on file  Occupational History  . Occupation: Retired from [employer] store    Employer: RETIRED  Social Needs  . Financial resource strain: Not on file  . Food insecurity:    Worry: Not on file    Inability: Not on file  . Transportation needs:    Medical: Not on file    Non-medical: Not on file  Tobacco Use  . Smoking status: Never Smoker  . Smokeless tobacco: Never Used  . Tobacco comment: tobacco use - no  Substance and Sexual Activity  . Alcohol use: No  . Drug use: No  . Sexual activity: Not on file    Comment: Hysterectomy  Lifestyle  . Physical activity:    Days per week: Not on file     Minutes per session: Not on file  . Stress: Not on file  Relationships  . Social connections:    Talks on phone: Not on file    Gets together: Not on file    Attends religious service: Not on file    Active member of club or organization: Not on file    Attends meetings of clubs or organizations: Not on file    Relationship status: Not on file  . Intimate partner violence:    Fear of current or ex partner: Not on file    Emotionally abused: Not on file    Physically abused: Not on file    Forced sexual activity: Not on file  Other Topics Concern  . Not on file  Social History Narrative   Lives in [place] with her husband.     Family History: Family History  Problem Relation Age of Onset  . Diabetes Father   . Lung cancer Father   . Colon cancer Neg Hx   . Colon polyps Neg Hx   . Esophageal cancer Neg Hx   . Rectal cancer Neg Hx   . Stomach cancer Neg Hx   . Breast cancer Neg Hx      Review of Systems: All other systems reviewed and are otherwise negative except as noted above.   Physical Exam: VS:  BP 134/76   Pulse 76   Ht 4\' 11"  (1.499 m)   Wt 125 lb (56.7 kg)   BMI 25.25 kg/m  , BMI Body mass index is 25.25 kg/m.  GEN- The patient is well appearing, alert and oriented x 3 today.   HEENT: normocephalic, atraumatic; sclera clear, conjunctiva pink; hearing intact; oropharynx clear; neck supple  Lungs- Clear to ausculation bilaterally, normal work of breathing.  No wheezes, rales, rhonchi Heart- Regular rate and rhythm (paced) GI- soft, non-tender, non-distended, bowel sounds present  Extremities- no clubbing, cyanosis, or edema  MS- no significant deformity or atrophy Skin- warm and dry, no rash or lesion; PPM pocket well healed Psych- euthymic mood, full affect Neuro- strength and sensation are intact  PPM Interrogation- reviewed in detail today,  See PACEART report  EKG:  EKG is not ordered today.  Recent Labs: 11/11/2018: ALT 8; BUN 16; Creatinine, Ser  1.41; Hemoglobin 9.8; Platelets 268; Potassium 3.8; Sodium 144   Wt Readings from Last 3 Encounters:  01/08/19 125 lb (56.7 kg)  07/11/18 126 lb (57.2 kg)  02/20/18 124 lb (56.2 kg)     Other studies Reviewed: Additional studies/ records that were reviewed today include: Renee's office notes  Assessment and Plan:  1.  Complete heart block Normal PPM function - pt is pacemaker dependent today See Pace Art report No changes today  2.  HTN Stable No change required today  3.  Chronic diastolic heart failure Stable No change required today    Current medicines are reviewed at length with the patient today.   The patient does not have concerns regarding her medicines.  The following changes were made today:  none  Labs/ tests ordered today include: none No orders of the defined types were placed in this encounter.    Disposition:   Follow up with [facility], Dr [name] 6 months     Signed, Chanetta Marshall, NP 01/08/2019 11:16 AM  [place] [address] [phone] (office) [phone] (fax)

## 2019-01-08 ENCOUNTER — Encounter (INDEPENDENT_AMBULATORY_CARE_PROVIDER_SITE_OTHER): Payer: Self-pay

## 2019-01-08 ENCOUNTER — Ambulatory Visit: Payer: Medicare Other | Admitting: Nurse Practitioner

## 2019-01-08 VITALS — BP 134/76 | HR 76 | Ht 59.0 in | Wt 125.0 lb

## 2019-01-08 DIAGNOSIS — I10 Essential (primary) hypertension: Secondary | ICD-10-CM

## 2019-01-08 DIAGNOSIS — I5032 Chronic diastolic (congestive) heart failure: Secondary | ICD-10-CM | POA: Diagnosis not present

## 2019-01-08 DIAGNOSIS — I442 Atrioventricular block, complete: Secondary | ICD-10-CM | POA: Diagnosis not present

## 2019-01-08 NOTE — Patient Instructions (Addendum)
Medication Instructions:   Your physician recommends that you continue on your current medications as directed. Please refer to the Current Medication list given to you today.   If you need a refill on your cardiac medications before your next appointment, please call your pharmacy.   Lab work: NONE ORDERED  TODAY   If you have labs (blood work) drawn today and your tests are completely normal, you will receive your results only by: Marland Kitchen MyChart Message (if you have MyChart) OR . A paper copy in the mail If you have any lab test that is abnormal or we need to change your treatment, we will call you to review the results.  Testing/Procedures: NONE ORDERED  TODAY    Follow-Up: At [facility], you and your health needs are our priority.  As part of our continuing mission to provide you with exceptional heart care, we have created designated Provider Care Teams.  These Care Teams include your primary Cardiologist (physician) and Advanced Practice Providers (APPs -  Physician Assistants and Nurse Practitioners) who all work together to provide you with the care you need, when you need it. You will need a follow up appointment in 1 years.  Please call our office 2 months in advance to schedule this appointment.  You may see  Dr. Lovena Le or one of the following Advanced Practice Providers on your designated Care Team:   Chanetta Marshall, NP . Tommye Standard, PA-C   Remote monitoring is used to monitor your Pacemaker of ICD from home. This monitoring reduces the number of office visits required to check your device to one time per year. It allows [place] to keep an eye on the functioning of your device to ensure it is working properly. You are scheduled for a device check from home on . 02-19-19 You may send your transmission at any time that day. If you have a wireless device, the transmission will be sent automatically. After your physician reviews your transmission, you will receive a postcard with your next  transmission date.    Any Other Special Instructions Will Be Listed Below (If Applicable).

## 2019-02-19 ENCOUNTER — Encounter: Payer: Medicare Other | Admitting: *Deleted

## 2019-02-19 ENCOUNTER — Other Ambulatory Visit: Payer: Self-pay

## 2019-02-20 ENCOUNTER — Telehealth: Payer: Self-pay

## 2019-02-20 NOTE — Telephone Encounter (Signed)
Spoke with patient to remind of missed remote transmission 

## 2019-02-20 NOTE — Telephone Encounter (Signed)
Left message for patient to remind of missed remote transmission.  

## 2019-02-21 ENCOUNTER — Encounter: Payer: Medicare Other | Admitting: Internal Medicine

## 2019-02-25 ENCOUNTER — Other Ambulatory Visit: Payer: Self-pay

## 2019-02-25 ENCOUNTER — Ambulatory Visit (INDEPENDENT_AMBULATORY_CARE_PROVIDER_SITE_OTHER): Payer: Medicare Other | Admitting: *Deleted

## 2019-02-25 DIAGNOSIS — I442 Atrioventricular block, complete: Secondary | ICD-10-CM

## 2019-02-25 LAB — CUP PACEART REMOTE DEVICE CHECK
Battery Remaining Longevity: 122 mo
Battery Voltage: 3.01 V
Brady Statistic AP VP Percent: 2.94 %
Brady Statistic AP VS Percent: 0 %
Brady Statistic AS VP Percent: 97.06 %
Brady Statistic AS VS Percent: 0 %
Brady Statistic RA Percent Paced: 2.93 %
Brady Statistic RV Percent Paced: 100 %
Date Time Interrogation Session: 20200406132335
Implantable Lead Implant Date: 20061024
Implantable Lead Implant Date: 20061024
Implantable Lead Location: 753859
Implantable Lead Location: 753860
Implantable Lead Model: 5076
Implantable Lead Model: 5076
Implantable Pulse Generator Implant Date: 20190104
Lead Channel Impedance Value: 247 Ohm
Lead Channel Impedance Value: 342 Ohm
Lead Channel Impedance Value: 418 Ohm
Lead Channel Impedance Value: 475 Ohm
Lead Channel Pacing Threshold Amplitude: 0.625 V
Lead Channel Pacing Threshold Amplitude: 0.625 V
Lead Channel Pacing Threshold Pulse Width: 0.4 ms
Lead Channel Pacing Threshold Pulse Width: 0.4 ms
Lead Channel Sensing Intrinsic Amplitude: 2.125 mV
Lead Channel Sensing Intrinsic Amplitude: 2.125 mV
Lead Channel Setting Pacing Amplitude: 1.5 V
Lead Channel Setting Pacing Amplitude: 2.5 V
Lead Channel Setting Pacing Pulse Width: 0.4 ms
Lead Channel Setting Sensing Sensitivity: 4 mV

## 2019-03-04 ENCOUNTER — Encounter: Payer: Self-pay | Admitting: Cardiology

## 2019-03-04 NOTE — Progress Notes (Signed)
Remote pacemaker transmission.   

## 2019-03-12 ENCOUNTER — Other Ambulatory Visit: Payer: Self-pay | Admitting: Physician Assistant

## 2019-05-27 ENCOUNTER — Ambulatory Visit (INDEPENDENT_AMBULATORY_CARE_PROVIDER_SITE_OTHER): Payer: Medicare Other | Admitting: *Deleted

## 2019-05-27 DIAGNOSIS — I442 Atrioventricular block, complete: Secondary | ICD-10-CM

## 2019-05-28 ENCOUNTER — Telehealth: Payer: Self-pay

## 2019-05-28 NOTE — Telephone Encounter (Signed)
Left message for patient to remind of missed remote transmission.  

## 2019-06-01 LAB — CUP PACEART REMOTE DEVICE CHECK
Battery Remaining Longevity: 116 mo
Battery Voltage: 3.01 V
Brady Statistic AP VP Percent: 4.54 %
Brady Statistic AP VS Percent: 0 %
Brady Statistic AS VP Percent: 95.46 %
Brady Statistic AS VS Percent: 0 %
Brady Statistic RA Percent Paced: 4.53 %
Brady Statistic RV Percent Paced: 100 %
Date Time Interrogation Session: 20200710233935
Implantable Lead Implant Date: 20061024
Implantable Lead Implant Date: 20061024
Implantable Lead Location: 753859
Implantable Lead Location: 753860
Implantable Lead Model: 5076
Implantable Lead Model: 5076
Implantable Pulse Generator Implant Date: 20190104
Lead Channel Impedance Value: 247 Ohm
Lead Channel Impedance Value: 361 Ohm
Lead Channel Impedance Value: 380 Ohm
Lead Channel Impedance Value: 418 Ohm
Lead Channel Pacing Threshold Amplitude: 0.625 V
Lead Channel Pacing Threshold Amplitude: 0.75 V
Lead Channel Pacing Threshold Pulse Width: 0.4 ms
Lead Channel Pacing Threshold Pulse Width: 0.4 ms
Lead Channel Sensing Intrinsic Amplitude: 2.125 mV
Lead Channel Sensing Intrinsic Amplitude: 2.125 mV
Lead Channel Setting Pacing Amplitude: 1.5 V
Lead Channel Setting Pacing Amplitude: 2.5 V
Lead Channel Setting Pacing Pulse Width: 0.4 ms
Lead Channel Setting Sensing Sensitivity: 4 mV

## 2019-06-03 ENCOUNTER — Encounter: Payer: Self-pay | Admitting: Cardiology

## 2019-06-03 NOTE — Progress Notes (Signed)
Remote pacemaker transmission.   

## 2019-07-30 ENCOUNTER — Other Ambulatory Visit: Payer: Self-pay | Admitting: Physician Assistant

## 2019-08-26 ENCOUNTER — Ambulatory Visit (INDEPENDENT_AMBULATORY_CARE_PROVIDER_SITE_OTHER): Payer: Medicare Other | Admitting: *Deleted

## 2019-08-26 DIAGNOSIS — I442 Atrioventricular block, complete: Secondary | ICD-10-CM

## 2019-08-26 LAB — CUP PACEART REMOTE DEVICE CHECK
Battery Remaining Longevity: 114 mo
Battery Voltage: 3.01 V
Brady Statistic AP VP Percent: 4.01 %
Brady Statistic AP VS Percent: 0 %
Brady Statistic AS VP Percent: 95.99 %
Brady Statistic AS VS Percent: 0 %
Brady Statistic RA Percent Paced: 4 %
Brady Statistic RV Percent Paced: 100 %
Date Time Interrogation Session: 20201005072011
Implantable Lead Implant Date: 20061024
Implantable Lead Implant Date: 20061024
Implantable Lead Location: 753859
Implantable Lead Location: 753860
Implantable Lead Model: 5076
Implantable Lead Model: 5076
Implantable Pulse Generator Implant Date: 20190104
Lead Channel Impedance Value: 247 Ohm
Lead Channel Impedance Value: 323 Ohm
Lead Channel Impedance Value: 380 Ohm
Lead Channel Impedance Value: 437 Ohm
Lead Channel Pacing Threshold Amplitude: 0.75 V
Lead Channel Pacing Threshold Amplitude: 0.875 V
Lead Channel Pacing Threshold Pulse Width: 0.4 ms
Lead Channel Pacing Threshold Pulse Width: 0.4 ms
Lead Channel Sensing Intrinsic Amplitude: 2.125 mV
Lead Channel Sensing Intrinsic Amplitude: 2.125 mV
Lead Channel Setting Pacing Amplitude: 1.5 V
Lead Channel Setting Pacing Amplitude: 2.5 V
Lead Channel Setting Pacing Pulse Width: 0.4 ms
Lead Channel Setting Sensing Sensitivity: 4 mV

## 2019-09-02 NOTE — Progress Notes (Signed)
Remote pacemaker transmission.   

## 2019-09-04 IMAGING — MG DIGITAL SCREENING BILATERAL MAMMOGRAM WITH TOMO AND CAD
8 series · 8 of 24 positions shown · non-contrast
Comparison: Previous exam(s).

CLINICAL DATA: Screening.

EXAM:
DIGITAL SCREENING BILATERAL MAMMOGRAM WITH TOMO AND CAD

[R CC synth-2D]
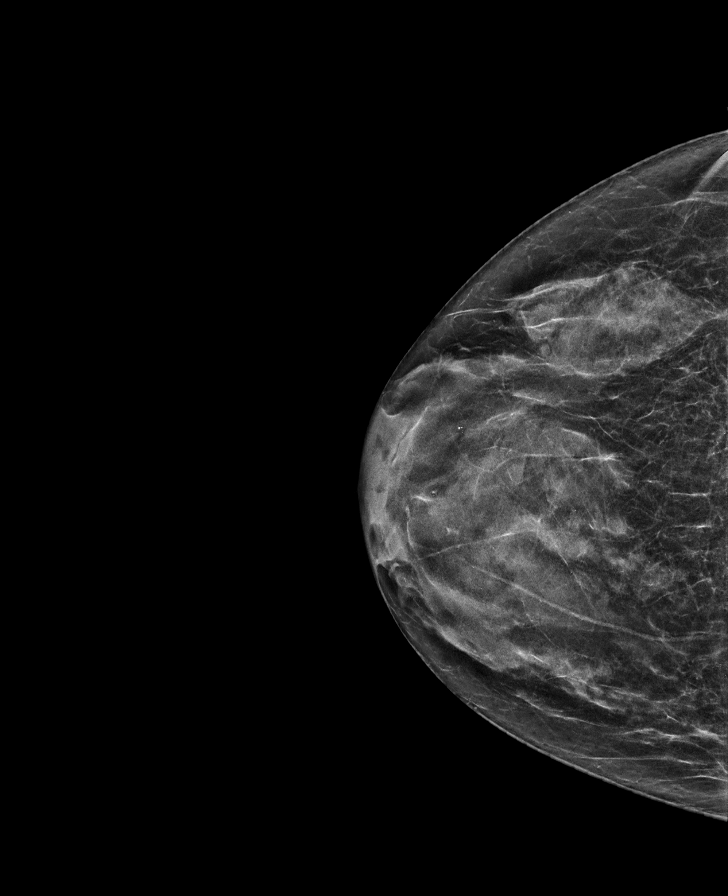

[R MLO synth-2D]
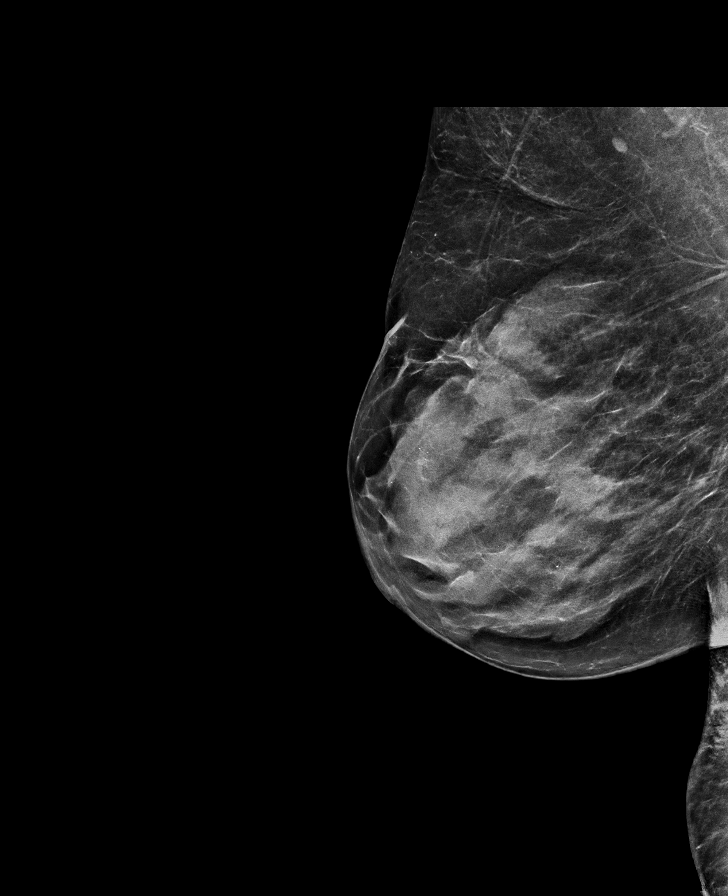

[L CC synth-2D]
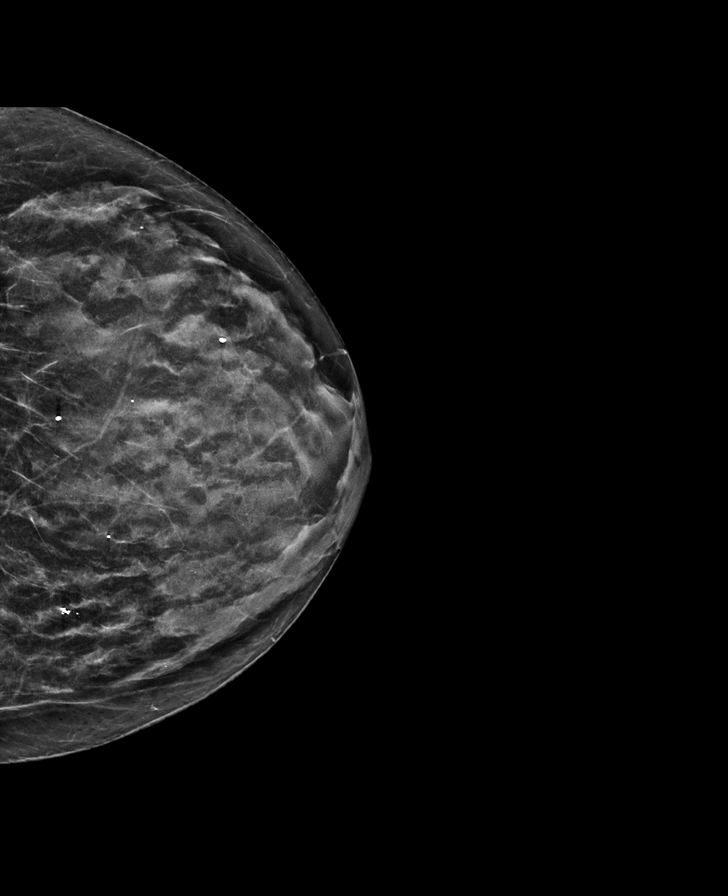

[L MLO synth-2D]
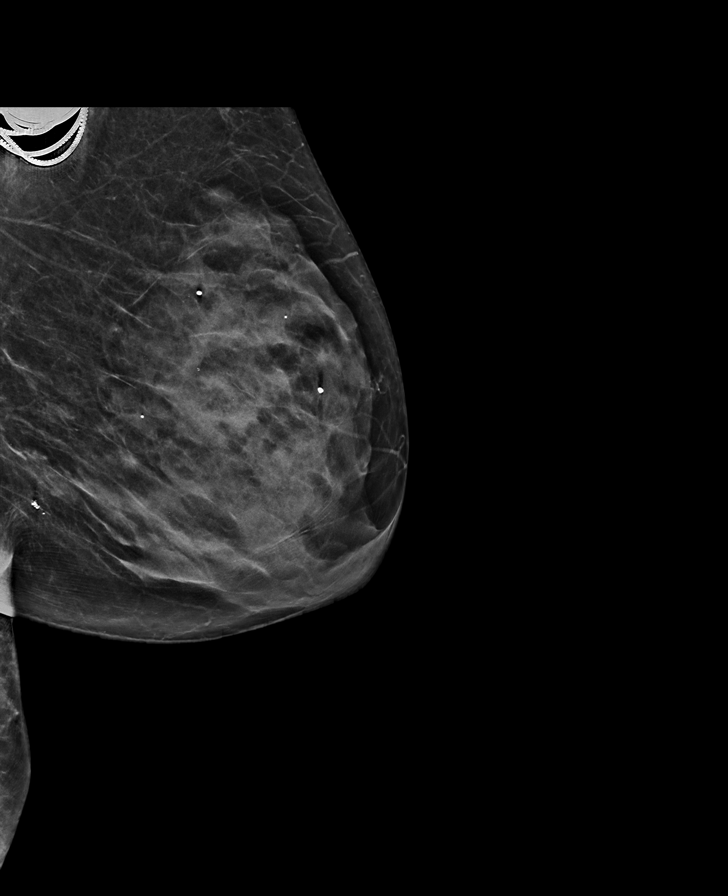

[R CC tomo · tomo slice 29/56.0]
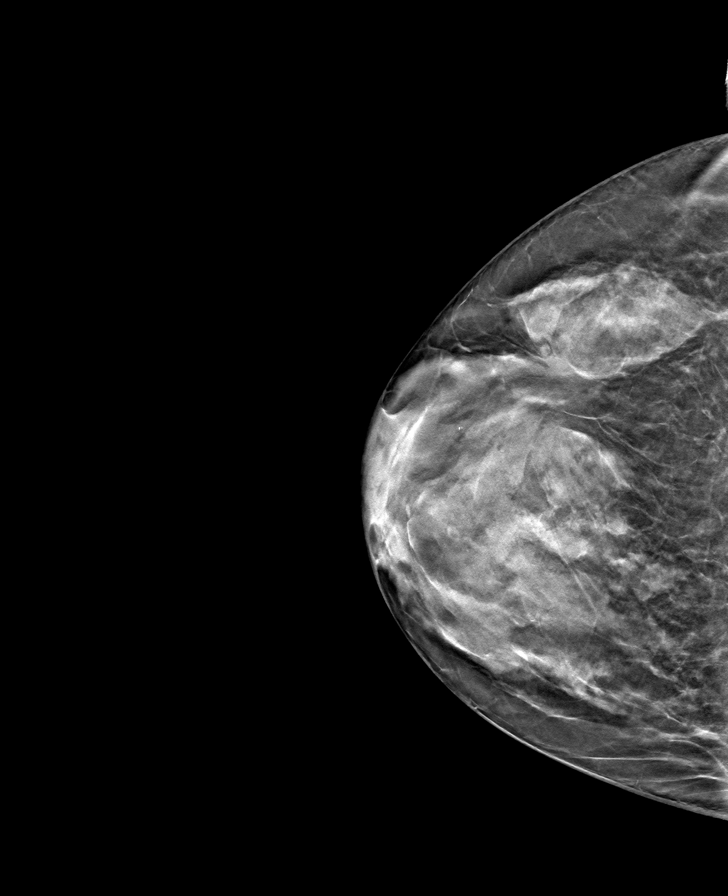

[R MLO tomo · tomo slice 34/67.0]
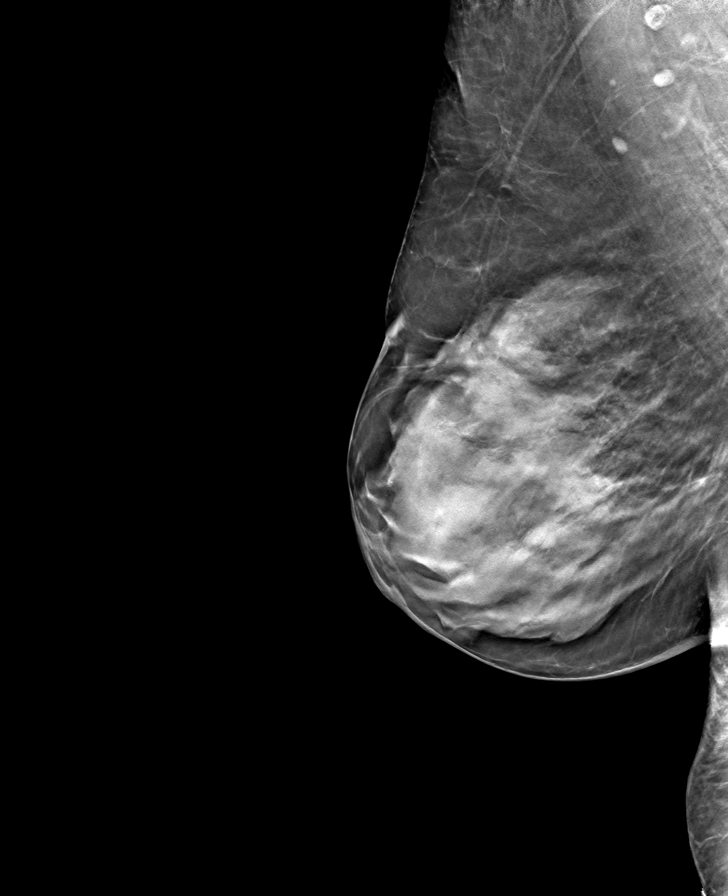

[L MLO tomo · tomo slice 33/66.0]
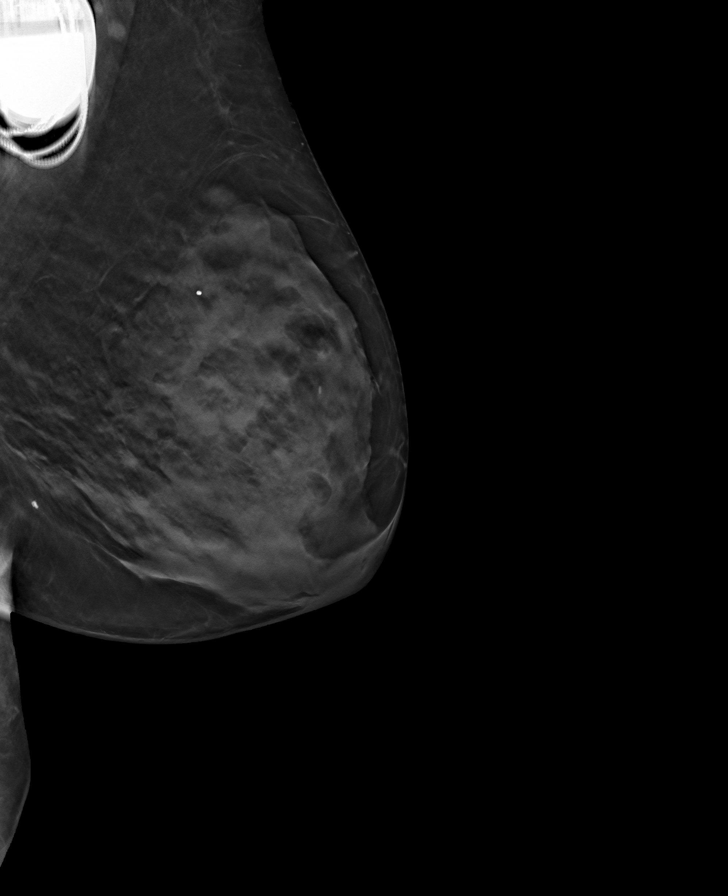

[L CC tomo · tomo slice 27/54.0]
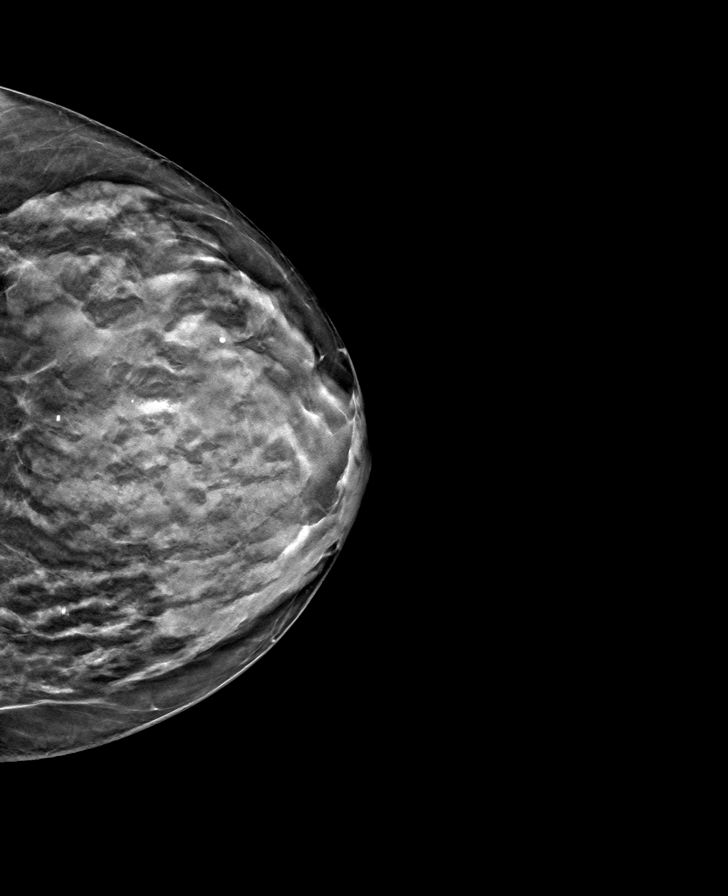

[8 of 24 positions shown; findings below may reference images not displayed]

ACR Breast Density Category c: The breast tissue is heterogeneously
dense, which may obscure small masses.
FINDINGS: There are no findings suspicious for malignancy. Images were
processed with CAD.
IMPRESSION: No mammographic evidence of malignancy. A result letter of this
screening mammogram will be mailed directly to the patient.

RECOMMENDATION:
Screening mammogram in one year. (Code:FT-U-LHB)

BI-RADS CATEGORY  1: Negative.

## 2019-10-01 ENCOUNTER — Other Ambulatory Visit: Payer: Self-pay | Admitting: Internal Medicine

## 2019-10-01 DIAGNOSIS — Z1231 Encounter for screening mammogram for malignant neoplasm of breast: Secondary | ICD-10-CM

## 2019-11-25 ENCOUNTER — Ambulatory Visit
Admission: RE | Admit: 2019-11-25 | Discharge: 2019-11-25 | Disposition: A | Discharge status: Home / Self Care | Payer: Medicare Other | Source: Ambulatory Visit | Attending: Internal Medicine | Admitting: Internal Medicine

## 2019-11-25 ENCOUNTER — Ambulatory Visit (INDEPENDENT_AMBULATORY_CARE_PROVIDER_SITE_OTHER): Payer: Medicare Other | Admitting: *Deleted

## 2019-11-25 ENCOUNTER — Other Ambulatory Visit: Payer: Self-pay

## 2019-11-25 DIAGNOSIS — I442 Atrioventricular block, complete: Secondary | ICD-10-CM

## 2019-11-25 DIAGNOSIS — Z1231 Encounter for screening mammogram for malignant neoplasm of breast: Secondary | ICD-10-CM

## 2019-11-25 LAB — CUP PACEART REMOTE DEVICE CHECK
Battery Remaining Longevity: 111 mo
Battery Voltage: 3 V
Brady Statistic AP VP Percent: 2.64 %
Brady Statistic AP VS Percent: 0 %
Brady Statistic AS VP Percent: 97.36 %
Brady Statistic AS VS Percent: 0 %
Brady Statistic RA Percent Paced: 2.63 %
Brady Statistic RV Percent Paced: 100 %
Date Time Interrogation Session: 20210103231303
Implantable Lead Implant Date: 20061024
Implantable Lead Implant Date: 20061024
Implantable Lead Location: 753859
Implantable Lead Location: 753860
Implantable Lead Model: 5076
Implantable Lead Model: 5076
Implantable Pulse Generator Implant Date: 20190104
Lead Channel Impedance Value: 247 Ohm
Lead Channel Impedance Value: 361 Ohm
Lead Channel Impedance Value: 380 Ohm
Lead Channel Impedance Value: 437 Ohm
Lead Channel Pacing Threshold Amplitude: 0.625 V
Lead Channel Pacing Threshold Amplitude: 0.75 V
Lead Channel Pacing Threshold Pulse Width: 0.4 ms
Lead Channel Pacing Threshold Pulse Width: 0.4 ms
Lead Channel Sensing Intrinsic Amplitude: 2.125 mV
Lead Channel Sensing Intrinsic Amplitude: 2.125 mV
Lead Channel Setting Pacing Amplitude: 1.5 V
Lead Channel Setting Pacing Amplitude: 2.5 V
Lead Channel Setting Pacing Pulse Width: 0.4 ms
Lead Channel Setting Sensing Sensitivity: 4 mV

## 2019-11-26 ENCOUNTER — Other Ambulatory Visit: Payer: Self-pay | Admitting: Internal Medicine

## 2019-11-26 DIAGNOSIS — R928 Other abnormal and inconclusive findings on diagnostic imaging of breast: Secondary | ICD-10-CM

## 2019-12-02 ENCOUNTER — Ambulatory Visit
Admission: RE | Admit: 2019-12-02 | Discharge: 2019-12-02 | Disposition: A | Discharge status: Home / Self Care | Payer: Medicare Other | Source: Ambulatory Visit | Attending: Internal Medicine | Admitting: Internal Medicine

## 2019-12-02 ENCOUNTER — Ambulatory Visit: Payer: Medicare Other

## 2019-12-02 ENCOUNTER — Other Ambulatory Visit: Payer: Self-pay

## 2019-12-02 DIAGNOSIS — R928 Other abnormal and inconclusive findings on diagnostic imaging of breast: Secondary | ICD-10-CM

## 2019-12-11 ENCOUNTER — Ambulatory Visit: Payer: Medicare Other | Attending: Internal Medicine

## 2019-12-11 DIAGNOSIS — Z23 Encounter for immunization: Secondary | ICD-10-CM | POA: Insufficient documentation

## 2019-12-11 NOTE — Progress Notes (Signed)
   Covid-19 Vaccination Clinic  Name:  [name]    MRN: [id number] DOB: [birth year]  12/11/2019  [name] was observed post Covid-19 immunization for 15 minutes without incidence. She was provided with Vaccine Information Sheet and instruction to access the V-Safe system.   [name] was instructed to call 911 with any severe reactions post vaccine: Marland Kitchen Difficulty breathing  . Swelling of your face and throat  . A fast heartbeat  . A bad rash all over your body  . Dizziness and weakness    Immunizations Administered    Name Date Dose VIS Date Route   Pfizer COVID-19 Vaccine 12/11/2019  6:32 PM 0.3 mL 11/01/2019 Intramuscular   Manufacturer: [place]   Lot: [id number]   [place]: [id number]

## 2019-12-17 ENCOUNTER — Other Ambulatory Visit: Payer: Self-pay

## 2019-12-17 ENCOUNTER — Ambulatory Visit (INDEPENDENT_AMBULATORY_CARE_PROVIDER_SITE_OTHER): Payer: Medicare Other

## 2019-12-17 ENCOUNTER — Encounter (HOSPITAL_COMMUNITY): Payer: Self-pay | Admitting: Emergency Medicine

## 2019-12-17 ENCOUNTER — Ambulatory Visit (HOSPITAL_COMMUNITY)
Admission: EM | Admit: 2019-12-17 | Discharge: 2019-12-17 | Disposition: A | Discharge status: Home / Self Care | Payer: Medicare Other | Attending: Urgent Care | Admitting: Urgent Care

## 2019-12-17 DIAGNOSIS — M25561 Pain in right knee: Secondary | ICD-10-CM | POA: Diagnosis not present

## 2019-12-17 NOTE — ED Provider Notes (Signed)
[name]   MRN: [id number] DOB: Jan 29, 1935  Subjective:   [name] is a 85 y.o. female presenting for 2-day history of acute onset right sided knee pain over posterior lateral side.  Patient states is very achy and mostly starts when she gets up, starts walking.  Denies any falls, redness, warmth, swelling.  Denies knee buckling.  Patient is very worried about arthritis or a blood clot.  She does not have an orthopedist, would like to get an x-ray.   Current Facility-Administered Medications:  .  0.9 %  sodium chloride infusion, 500 mL, Intravenous, Continuous, Ladene Artist, MD  Current Outpatient Medications:  .  acetaminophen (TYLENOL) 325 MG tablet, Take 325 mg by mouth every 6 (six) hours as needed (for pain/headaches.)., Disp: , Rfl:  .  aspirin EC 81 MG tablet, Take 81 mg by mouth daily., Disp: , Rfl:  .  Calcium Carb-Cholecalciferol (CALCIUM 500 + D3) 500-200 MG-UNIT TABS, Take 1 tablet by mouth every other day., Disp: , Rfl:  .  Cholecalciferol (VITAMIN D3) 2000 units TABS, Take 2,000 Units by mouth daily., Disp: , Rfl:  .  furosemide (LASIX) 40 MG tablet, TAKE 1/2 TABLET, Disp: 45 tablet, Rfl: 1 .  glipiZIDE (GLUCOTROL XL) 10 MG 24 hr tablet, Take 10 mg by mouth 2 (two) times daily.  , Disp: , Rfl:  .  lisinopril (ZESTRIL) 20 MG tablet, TAKE 1 TABLET BY MOUTH EVERY DAY, Disp: 90 tablet, Rfl: 1 .  meclizine (ANTIVERT) 25 MG tablet, Take 25 mg by mouth 2 (two) times daily as needed for dizziness or nausea. , Disp: , Rfl:  .  metoprolol (LOPRESSOR) 50 MG tablet, Take 50 mg by mouth 2 (two) times daily.  , Disp: , Rfl:  .  Multiple Vitamin (MULTIVITAMIN WITH MINERALS) TABS tablet, Take 1 tablet by mouth daily., Disp: , Rfl:  .  naproxen (NAPROSYN) 375 MG tablet, Take 1 tablet (375 mg total) by mouth 2 (two) times daily., Disp: 30 tablet, Rfl: 0 .  pioglitazone (ACTOS) 15 MG tablet, Take 15 mg by mouth daily., Disp: , Rfl:  .  simvastatin (ZOCOR) 40 MG tablet,  Take 40 mg by mouth daily. , Disp: , Rfl:  .  sitaGLIPtan-metformin (JANUMET) 50-1000 MG per tablet, Take 1 tablet by mouth 2 (two) times daily. , Disp: , Rfl:    Allergies  Allergen Reactions  . Sulfa Drugs Cross Reactors     Swelling, blisters and itching    Past Medical History:  Diagnosis Date  . Atrioventricular block, complete ([place])   . Diverticulosis   . DM (diabetes mellitus) ([place])   . Hemorrhoids   . HTN (hypertension)   . Hyperlipidemia   . Osteoporosis   . Pacemaker   . Tubular adenoma of colon 03/2010     Past Surgical History:  Procedure Laterality Date  . BRAIN BIOPSY  1998   Benign  . CARDIAC CATHETERIZATION  09/12/05   Dr. Jenkins Rouge   . COLONOSCOPY  2013   [facility] -polyps  . PACEMAKER INSERTION  09/13/05   Medtronic, dual chamber. Dr. Lovena Le   . PPM GENERATOR CHANGEOUT N/A 11/24/2017   Procedure: PPM GENERATOR CHANGEOUT;  Surgeon: Evans Lance, MD;  Location: [facility];  Service: Cardiovascular;  Laterality: N/A;  . TOTAL ABDOMINAL HYSTERECTOMY W/ BILATERAL SALPINGOOPHORECTOMY    . WISDOM TOOTH EXTRACTION      Family History  Problem Relation Age of Onset  . Diabetes Father   .  Lung cancer Father   . Colon cancer Neg Hx   . Colon polyps Neg Hx   . Esophageal cancer Neg Hx   . Rectal cancer Neg Hx   . Stomach cancer Neg Hx   . Breast cancer Neg Hx     Social History   Tobacco Use  . Smoking status: Never Smoker  . Smokeless tobacco: Never Used  . Tobacco comment: tobacco use - no  Substance Use Topics  . Alcohol use: No  . Drug use: No    ROS   Objective:   Vitals: BP (!) 157/51 (BP Location: Right Arm)   Pulse 71   Temp 97.6 F (36.4 C) (Oral)   Resp 18   SpO2 100%   Physical Exam Constitutional:      General: She is not in acute distress.    Appearance: Normal appearance. She is well-developed. She is not ill-appearing, toxic-appearing or diaphoretic.  HENT:     Head: Normocephalic and atraumatic.     Nose:  Nose normal.     Mouth/Throat:     Mouth: Mucous membranes are moist.     Pharynx: Oropharynx is clear.  Eyes:     General: No scleral icterus.    Extraocular Movements: Extraocular movements intact.     Pupils: Pupils are equal, round, and reactive to light.  Cardiovascular:     Rate and Rhythm: Normal rate.  Pulmonary:     Effort: Pulmonary effort is normal.  Musculoskeletal:     Right knee: No swelling, deformity, effusion, erythema, ecchymosis, lacerations, bony tenderness or crepitus. Normal range of motion. Tenderness (posteriorly) present over the lateral joint line. No medial joint line or patellar tendon tenderness. Normal alignment, normal meniscus and normal patellar mobility.  Skin:    General: Skin is warm and dry.  Neurological:     General: No focal deficit present.     Mental Status: She is alert and oriented to person, place, and time.  Psychiatric:        Mood and Affect: Mood normal.        Behavior: Behavior normal.     DG Knee Complete 4 Views Right  Result Date: 12/17/2019 CLINICAL DATA:  Per pt: was reaching way up to clean the tub door, pain in the right knee ever since. Pain is the right knee, medial distal to the AP patella, predominant pain is posteriorly/laterally. No surgery to the right knee. Patient is a diabetiright knee pain EXAM: RIGHT KNEE - COMPLETE 4+ VIEW COMPARISON:  None. FINDINGS: No fracture of the proximal tibia or distal femur. Patella is normal. No joint effusion. Loose bodies posterior to the knee joint. IMPRESSION: No acute findings the RIGHT knee. Electronically Signed   By: Suzy Bouchard M.D.   On: 12/17/2019 14:01     Assessment and Plan :   1. Acute pain of right knee     Patient is to schedule Tylenol, rest and contact orthopedist at [facility] for further evaluation including consideration of imaging. Counseled patient on potential for adverse effects with medications prescribed/recommended today, ER and return-to-clinic  precautions discussed, patient verbalized understanding.    Jaynee Eagles, PA-C 12/17/19 1407

## 2019-12-17 NOTE — ED Triage Notes (Signed)
Pt sts right knee pain x 2 days only when walking

## 2019-12-17 NOTE — Discharge Instructions (Signed)
You may take 500mg -650mg  Tylenol every 6 hours for pain of your right knee. Contact [facility] to discuss MRI or more imaging for your right knee.

## 2019-12-31 ENCOUNTER — Ambulatory Visit: Payer: Medicare Other | Attending: Internal Medicine

## 2019-12-31 DIAGNOSIS — Z23 Encounter for immunization: Secondary | ICD-10-CM | POA: Insufficient documentation

## 2019-12-31 NOTE — Progress Notes (Signed)
   Covid-19 Vaccination Clinic  Name:  [name]    MRN: [id number] DOB: [birth year]  12/31/2019  [name] was observed post Covid-19 immunization for 15 minutes without incidence. She was provided with Vaccine Information Sheet and instruction to access the V-Safe system.   [name] was instructed to call 911 with any severe reactions post vaccine: Marland Kitchen Difficulty breathing  . Swelling of your face and throat  . A fast heartbeat  . A bad rash all over your body  . Dizziness and weakness    Immunizations Administered    Name Date Dose VIS Date Route   Pfizer COVID-19 Vaccine 12/31/2019  9:06 AM 0.3 mL 11/01/2019 Intramuscular   Manufacturer: [place]   Lot: [id number]   [place]: [id number]

## 2020-01-17 ENCOUNTER — Encounter: Payer: Self-pay | Admitting: Internal Medicine

## 2020-01-17 ENCOUNTER — Ambulatory Visit (INDEPENDENT_AMBULATORY_CARE_PROVIDER_SITE_OTHER): Payer: Medicare Other | Admitting: Internal Medicine

## 2020-01-17 VITALS — BP 102/66 | HR 71 | Ht 59.0 in | Wt 124.0 lb

## 2020-01-17 DIAGNOSIS — Z950 Presence of cardiac pacemaker: Secondary | ICD-10-CM | POA: Diagnosis not present

## 2020-01-17 DIAGNOSIS — I442 Atrioventricular block, complete: Secondary | ICD-10-CM

## 2020-01-17 DIAGNOSIS — I10 Essential (primary) hypertension: Secondary | ICD-10-CM

## 2020-01-17 DIAGNOSIS — I5032 Chronic diastolic (congestive) heart failure: Secondary | ICD-10-CM

## 2020-01-17 NOTE — Patient Instructions (Signed)
Medication Instructions:  Your physician recommends that you continue on your current medications as directed. Please refer to the Current Medication list given to you today.  Labwork: None ordered.  Testing/Procedures: None ordered.  Follow-Up: Your physician wants you to follow-up in: one year with Dr. Lovena Le.   You will receive a reminder letter in the mail two months in advance. If you don't receive a letter, please call our office to schedule the follow-up appointment.  Remote monitoring is used to monitor your Pacemaker from home. This monitoring reduces the number of office visits required to check your device to one time per year. It allows [place] to keep an eye on the functioning of your device to ensure it is working properly. You are scheduled for a device check from home on 02/24/2020. You may send your transmission at any time that day. If you have a wireless device, the transmission will be sent automatically. After your physician reviews your transmission, you will receive a postcard with your next transmission date.  Any Other Special Instructions Will Be Listed Below (If Applicable).  If you need a refill on your cardiac medications before your next appointment, please call your pharmacy.

## 2020-01-17 NOTE — Progress Notes (Signed)
HPI [name] returns today for followup. She is a pleasant 85 yo woman with CHB, s/p PPM insertion. She has HTN. She has done well in the interim with no chest pain or sob. No edema. No syncope. Allergies  Allergen Reactions  . Sulfa Drugs Cross Reactors     Swelling, blisters and itching     Current Outpatient Medications  Medication Sig Dispense Refill  . acetaminophen (TYLENOL) 325 MG tablet Take 325 mg by mouth every 6 (six) hours as needed (for pain/headaches.).    Marland Kitchen aspirin EC 81 MG tablet Take 81 mg by mouth daily.    . Calcium Carb-Cholecalciferol (CALCIUM 500 + D3) 500-200 MG-UNIT TABS Take 1 tablet by mouth every other day.    . Cholecalciferol (VITAMIN D3) 2000 units TABS Take 2,000 Units by mouth daily.    . furosemide (LASIX) 40 MG tablet TAKE 1/2 TABLET 45 tablet 1  . glipiZIDE (GLUCOTROL XL) 10 MG 24 hr tablet Take 10 mg by mouth 2 (two) times daily.      Marland Kitchen lisinopril (ZESTRIL) 20 MG tablet TAKE 1 TABLET BY MOUTH EVERY DAY 90 tablet 1  . meclizine (ANTIVERT) 25 MG tablet Take 25 mg by mouth 2 (two) times daily as needed for dizziness or nausea.     . metoprolol (LOPRESSOR) 50 MG tablet Take 50 mg by mouth 2 (two) times daily.      . Multiple Vitamin (MULTIVITAMIN WITH MINERALS) TABS tablet Take 1 tablet by mouth daily.    . pioglitazone (ACTOS) 15 MG tablet Take 15 mg by mouth daily.    . simvastatin (ZOCOR) 40 MG tablet Take 40 mg by mouth daily.     . sitaGLIPtan-metformin (JANUMET) 50-1000 MG per tablet Take 1 tablet by mouth 2 (two) times daily.      Current Facility-Administered Medications  Medication Dose Route Frequency Provider Last Rate Last Admin  . 0.9 %  sodium chloride infusion  500 mL Intravenous Continuous Ladene Artist, MD         Past Medical History:  Diagnosis Date  . Atrioventricular block, complete ([place])   . Diverticulosis   . DM (diabetes mellitus) ([place])   . Hemorrhoids   . HTN (hypertension)   . Hyperlipidemia   .  Osteoporosis   . Pacemaker   . Tubular adenoma of colon 03/2010    ROS:   All systems reviewed and negative except as noted in the HPI.   Past Surgical History:  Procedure Laterality Date  . BRAIN BIOPSY  1998   Benign  . CARDIAC CATHETERIZATION  09/12/05   Dr. Jenkins Rouge   . COLONOSCOPY  2013   [facility] -polyps  . PACEMAKER INSERTION  09/13/05   Medtronic, dual chamber. Dr. Lovena Le   . PPM GENERATOR CHANGEOUT N/A 11/24/2017   Procedure: PPM GENERATOR CHANGEOUT;  Surgeon: Evans Lance, MD;  Location: [facility];  Service: Cardiovascular;  Laterality: N/A;  . TOTAL ABDOMINAL HYSTERECTOMY W/ BILATERAL SALPINGOOPHORECTOMY    . WISDOM TOOTH EXTRACTION       Family History  Problem Relation Age of Onset  . Diabetes Father   . Lung cancer Father   . Colon cancer Neg Hx   . Colon polyps Neg Hx   . Esophageal cancer Neg Hx   . Rectal cancer Neg Hx   . Stomach cancer Neg Hx   . Breast cancer Neg Hx      Social History   Socioeconomic History  .  Marital status: Married    Spouse name: Not on file  . Number of children: 1  . Years of education: Not on file  . Highest education level: Not on file  Occupational History  . Occupation: Retired from [employer] store    Employer: RETIRED  Tobacco Use  . Smoking status: Never Smoker  . Smokeless tobacco: Never Used  . Tobacco comment: tobacco use - no  Substance and Sexual Activity  . Alcohol use: No  . Drug use: No  . Sexual activity: Not on file    Comment: Hysterectomy  Other Topics Concern  . Not on file  Social History Narrative   Lives in [place] with her husband.    Social Determinants of Health   Financial Resource Strain:   . Difficulty of Paying Living Expenses: Not on file  Food Insecurity:   . Worried About Charity fundraiser in the Last Year: Not on file  . Ran Out of Food in the Last Year: Not on file  Transportation Needs:   . Lack of Transportation (Medical): Not on file  . Lack of Transportation  (Non-Medical): Not on file  Physical Activity:   . Days of Exercise per Week: Not on file  . Minutes of Exercise per Session: Not on file  Stress:   . Feeling of Stress : Not on file  Social Connections:   . Frequency of Communication with Friends and Family: Not on file  . Frequency of Social Gatherings with Friends and Family: Not on file  . Attends Religious Services: Not on file  . Active Member of Clubs or Organizations: Not on file  . Attends Archivist Meetings: Not on file  . Marital Status: Not on file  Intimate Partner Violence:   . Fear of Current or Ex-Partner: Not on file  . Emotionally Abused: Not on file  . Physically Abused: Not on file  . Sexually Abused: Not on file     BP 102/66   Pulse 71   Ht 4\' 11"  (1.499 m)   Wt 124 lb (56.2 kg)   SpO2 99%   BMI 25.04 kg/m   Physical Exam:  Well appearing NAD HEENT: Unremarkable Neck:  No JVD, no thyromegally Lymphatics:  No adenopathy Back:  No CVA tenderness Lungs:  Clear with no wheezes HEART:  Regular rate rhythm, no murmurs, no rubs, no clicks Abd:  soft, positive bowel sounds, no organomegally, no rebound, no guarding Ext:  2 plus pulses, no edema, no cyanosis, no clubbing Skin:  No rashes no nodules Neuro:  CN II through XII intact, motor grossly intact  EKG - NSR with ventricular pacing  DEVICE  Normal device function.  See PaceArt for details.   Assess/Plan: 1. CHB - she is asymptomatic s/p PPM insertion.  2. HTN - her bp is well controlled. We will follow 3. Chronic diastolic heart failure - her symptoms are class 2. She will continue her current meds.  Mikle Bosworth.D.

## 2020-02-24 ENCOUNTER — Ambulatory Visit (INDEPENDENT_AMBULATORY_CARE_PROVIDER_SITE_OTHER): Payer: Medicare Other | Admitting: *Deleted

## 2020-02-24 DIAGNOSIS — I442 Atrioventricular block, complete: Secondary | ICD-10-CM

## 2020-02-24 LAB — CUP PACEART REMOTE DEVICE CHECK
Battery Remaining Longevity: 108 mo
Battery Voltage: 3 V
Brady Statistic AP VP Percent: 5.39 %
Brady Statistic AP VS Percent: 0 %
Brady Statistic AS VP Percent: 94.61 %
Brady Statistic AS VS Percent: 0 %
Brady Statistic RA Percent Paced: 5.37 %
Brady Statistic RV Percent Paced: 100 %
Date Time Interrogation Session: 20210405001514
Implantable Lead Implant Date: 20061024
Implantable Lead Implant Date: 20061024
Implantable Lead Location: 753859
Implantable Lead Location: 753860
Implantable Lead Model: 5076
Implantable Lead Model: 5076
Implantable Pulse Generator Implant Date: 20190104
Lead Channel Impedance Value: 285 Ohm
Lead Channel Impedance Value: 380 Ohm
Lead Channel Impedance Value: 399 Ohm
Lead Channel Impedance Value: 437 Ohm
Lead Channel Pacing Threshold Amplitude: 0.75 V
Lead Channel Pacing Threshold Amplitude: 0.75 V
Lead Channel Pacing Threshold Pulse Width: 0.4 ms
Lead Channel Pacing Threshold Pulse Width: 0.4 ms
Lead Channel Sensing Intrinsic Amplitude: 2 mV
Lead Channel Sensing Intrinsic Amplitude: 2 mV
Lead Channel Setting Pacing Amplitude: 1.5 V
Lead Channel Setting Pacing Amplitude: 2.5 V
Lead Channel Setting Pacing Pulse Width: 0.4 ms
Lead Channel Setting Sensing Sensitivity: 4 mV

## 2020-02-25 NOTE — Progress Notes (Signed)
PPM Remote  

## 2020-02-29 ENCOUNTER — Other Ambulatory Visit: Payer: Self-pay | Admitting: Internal Medicine

## 2020-05-26 ENCOUNTER — Ambulatory Visit (INDEPENDENT_AMBULATORY_CARE_PROVIDER_SITE_OTHER): Payer: Medicare Other | Admitting: *Deleted

## 2020-05-26 DIAGNOSIS — I442 Atrioventricular block, complete: Secondary | ICD-10-CM

## 2020-05-26 LAB — CUP PACEART REMOTE DEVICE CHECK
Battery Remaining Longevity: 105 mo
Battery Voltage: 3 V
Brady Statistic AP VP Percent: 5.12 %
Brady Statistic AP VS Percent: 0 %
Brady Statistic AS VP Percent: 94.88 %
Brady Statistic AS VS Percent: 0 %
Brady Statistic RA Percent Paced: 5.1 %
Brady Statistic RV Percent Paced: 100 %
Date Time Interrogation Session: 20210705213256
Implantable Lead Implant Date: 20061024
Implantable Lead Implant Date: 20061024
Implantable Lead Location: 753859
Implantable Lead Location: 753860
Implantable Lead Model: 5076
Implantable Lead Model: 5076
Implantable Pulse Generator Implant Date: 20190104
Lead Channel Impedance Value: 266 Ohm
Lead Channel Impedance Value: 380 Ohm
Lead Channel Impedance Value: 380 Ohm
Lead Channel Impedance Value: 437 Ohm
Lead Channel Pacing Threshold Amplitude: 0.75 V
Lead Channel Pacing Threshold Amplitude: 0.75 V
Lead Channel Pacing Threshold Pulse Width: 0.4 ms
Lead Channel Pacing Threshold Pulse Width: 0.4 ms
Lead Channel Sensing Intrinsic Amplitude: 2 mV
Lead Channel Sensing Intrinsic Amplitude: 2 mV
Lead Channel Setting Pacing Amplitude: 1.5 V
Lead Channel Setting Pacing Amplitude: 2.5 V
Lead Channel Setting Pacing Pulse Width: 0.4 ms
Lead Channel Setting Sensing Sensitivity: 4 mV

## 2020-05-27 ENCOUNTER — Other Ambulatory Visit: Payer: Self-pay

## 2020-05-27 ENCOUNTER — Ambulatory Visit: Payer: Medicare Other | Admitting: Podiatry

## 2020-05-27 ENCOUNTER — Encounter: Payer: Self-pay | Admitting: Podiatry

## 2020-05-27 DIAGNOSIS — E119 Type 2 diabetes mellitus without complications: Secondary | ICD-10-CM

## 2020-05-27 NOTE — Progress Notes (Signed)
This patient returns to my office for at risk foot care.  This patient requires this care by a professional since this patient will be at risk due to having diabetes.  She presents today for a diabetic foot exam.   This patient presents for at risk foot care today.  General Appearance  Alert, conversant and in no acute stress.  Vascular  Dorsalis pedis and posterior tibial  pulses are palpable  bilaterally.  Capillary return is within normal limits  bilaterally. Temperature is within normal limits  bilaterally.  Neurologic  Senn-Weinstein monofilament wire test within normal limits  bilaterally. Muscle power within normal limits bilaterally.  Nails Thick disfigured discolored nails with subungual debris  from hallux to fifth toes bilaterally. No evidence of bacterial infection or drainage bilaterally.  Orthopedic  No limitations of motion  feet .  No crepitus or effusions noted.  No bony pathology or digital deformities noted. HAV  B/L.  DJD midfoot  B/L.    Skin  normotropic skin with no porokeratosis noted bilaterally.  No signs of infections or ulcers noted.     Diabetes with no foot complications.  Consent was obtained for treatment procedures.   Diabetic foot exam reveals no vascular or neurologic pathology.  Patient requests nail care in future.   Return office visit    3 months.                 Told patient to return for periodic foot care and evaluation due to potential at risk complications.   Corretta Munce DPM  

## 2020-05-27 NOTE — Progress Notes (Signed)
Remote pacemaker transmission.   

## 2020-08-24 ENCOUNTER — Ambulatory Visit (INDEPENDENT_AMBULATORY_CARE_PROVIDER_SITE_OTHER): Payer: Medicare Other

## 2020-08-24 DIAGNOSIS — I442 Atrioventricular block, complete: Secondary | ICD-10-CM

## 2020-08-24 LAB — CUP PACEART REMOTE DEVICE CHECK
Battery Remaining Longevity: 102 mo
Battery Voltage: 2.99 V
Brady Statistic AP VP Percent: 4.82 %
Brady Statistic AP VS Percent: 0 %
Brady Statistic AS VP Percent: 95.18 %
Brady Statistic AS VS Percent: 0 %
Brady Statistic RA Percent Paced: 4.81 %
Brady Statistic RV Percent Paced: 100 %
Date Time Interrogation Session: 20211003215456
Implantable Lead Implant Date: 20061024
Implantable Lead Implant Date: 20061024
Implantable Lead Location: 753859
Implantable Lead Location: 753860
Implantable Lead Model: 5076
Implantable Lead Model: 5076
Implantable Pulse Generator Implant Date: 20190104
Lead Channel Impedance Value: 266 Ohm
Lead Channel Impedance Value: 361 Ohm
Lead Channel Impedance Value: 380 Ohm
Lead Channel Impedance Value: 437 Ohm
Lead Channel Pacing Threshold Amplitude: 0.75 V
Lead Channel Pacing Threshold Amplitude: 0.875 V
Lead Channel Pacing Threshold Pulse Width: 0.4 ms
Lead Channel Pacing Threshold Pulse Width: 0.4 ms
Lead Channel Sensing Intrinsic Amplitude: 1.875 mV
Lead Channel Sensing Intrinsic Amplitude: 1.875 mV
Lead Channel Setting Pacing Amplitude: 1.5 V
Lead Channel Setting Pacing Amplitude: 2.5 V
Lead Channel Setting Pacing Pulse Width: 0.4 ms
Lead Channel Setting Sensing Sensitivity: 4 mV

## 2020-08-25 NOTE — Progress Notes (Signed)
Remote pacemaker transmission.   

## 2020-08-27 ENCOUNTER — Other Ambulatory Visit: Payer: Self-pay | Admitting: Internal Medicine

## 2020-08-28 ENCOUNTER — Encounter: Payer: Self-pay | Admitting: Podiatry

## 2020-08-28 ENCOUNTER — Ambulatory Visit: Payer: Medicare Other | Admitting: Podiatry

## 2020-08-28 ENCOUNTER — Other Ambulatory Visit: Payer: Self-pay

## 2020-08-28 DIAGNOSIS — E119 Type 2 diabetes mellitus without complications: Secondary | ICD-10-CM

## 2020-08-28 NOTE — Progress Notes (Signed)
This patient returns to my office for at risk foot care.  This patient requires this care by a professional since this patient will be at risk due to having diabetes.  She presents today for a diabetic foot exam.   This patient presents for at risk foot care today.  General Appearance  Alert, conversant and in no acute stress.  Vascular  Dorsalis pedis and posterior tibial  pulses are palpable  bilaterally.  Capillary return is within normal limits  bilaterally. Temperature is within normal limits  bilaterally.  Neurologic  Senn-Weinstein monofilament wire test within normal limits  bilaterally. Muscle power within normal limits bilaterally.  Nails Thick disfigured discolored nails with subungual debris  from hallux to fifth toes bilaterally. No evidence of bacterial infection or drainage bilaterally.  Orthopedic  No limitations of motion  feet .  No crepitus or effusions noted.  No bony pathology or digital deformities noted. HAV  B/L.  DJD midfoot  B/L.    Skin  normotropic skin with no porokeratosis noted bilaterally.  No signs of infections or ulcers noted.     Diabetes with no foot complications.  Consent was obtained for treatment procedures.   Diabetic foot exam reveals no vascular or neurologic pathology.  Patient requests nail care in future.   Return office visit    3 months.                 Told patient to return for periodic foot care and evaluation due to potential at risk complications.   [place] Barefoot DPM

## 2020-10-28 ENCOUNTER — Other Ambulatory Visit: Payer: Self-pay | Admitting: Internal Medicine

## 2020-10-28 DIAGNOSIS — Z1231 Encounter for screening mammogram for malignant neoplasm of breast: Secondary | ICD-10-CM

## 2020-11-05 IMAGING — DX DG KNEE COMPLETE 4+V*R*
4 series · 4 of 4 positions shown · non-contrast
Comparison: None.

CLINICAL DATA: Per pt: was reaching way up to clean the tub door,
pain in the right knee ever since. Pain is the right knee, medial
distal to the AP patella, predominant pain is posteriorly/laterally.
No surgery to the right knee. Patient is a diabetiright knee pain

EXAM:
RIGHT KNEE - COMPLETE 4+ VIEW

[knee ap]
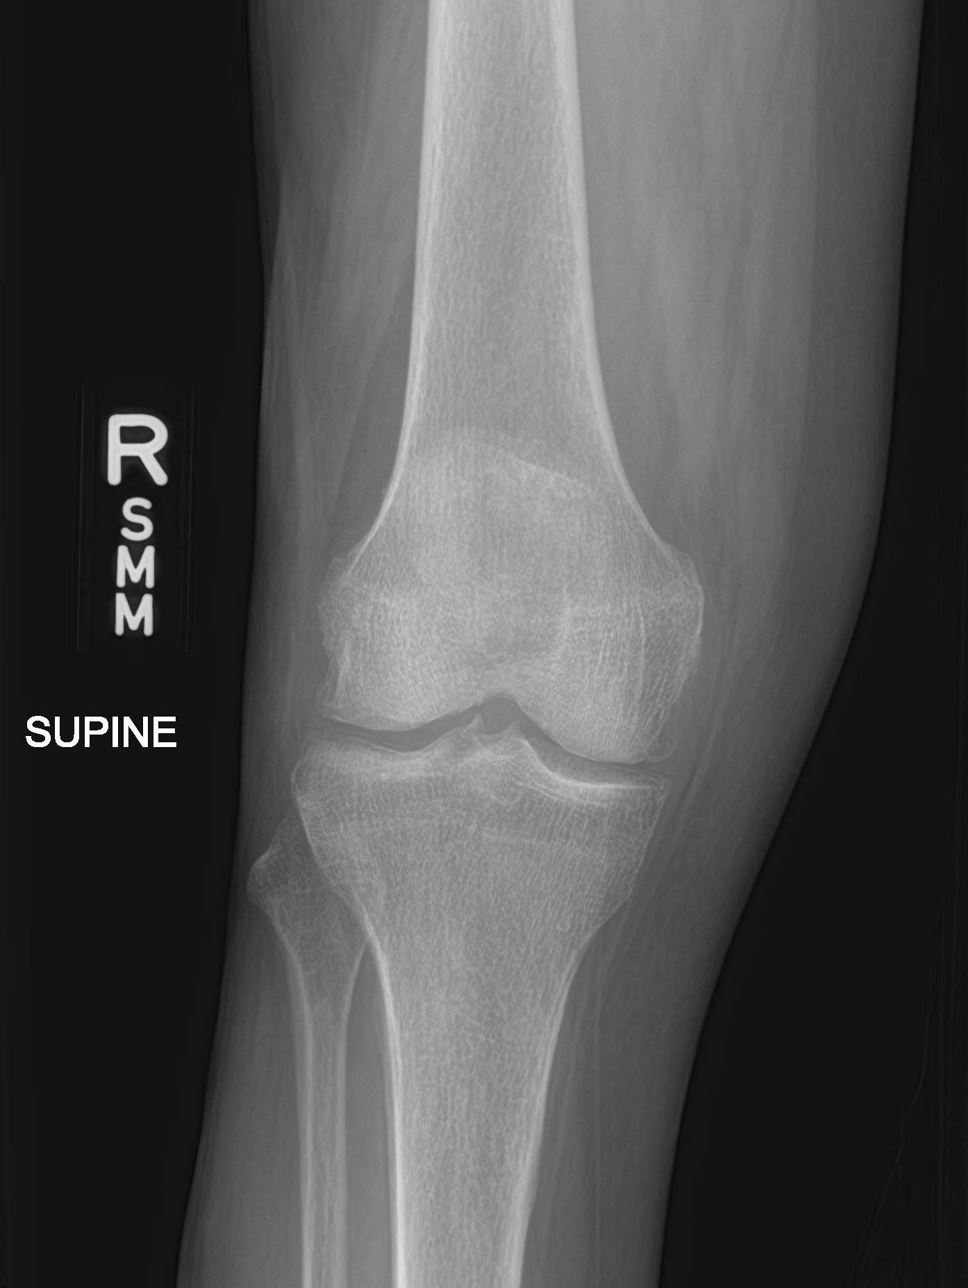

[knee obl (1 of 2)]
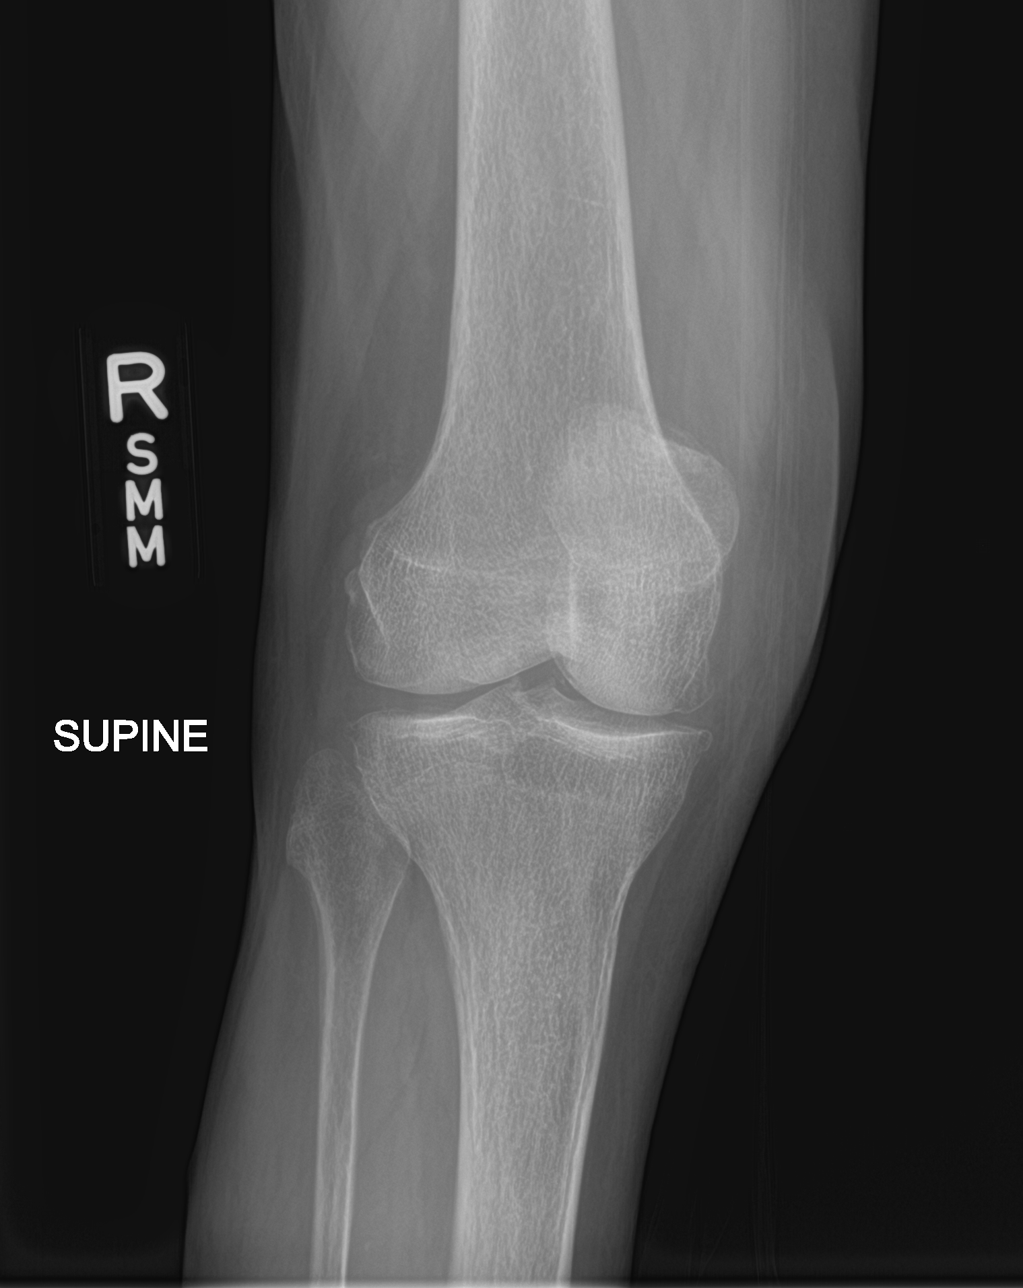

[knee obl (2 of 2)]
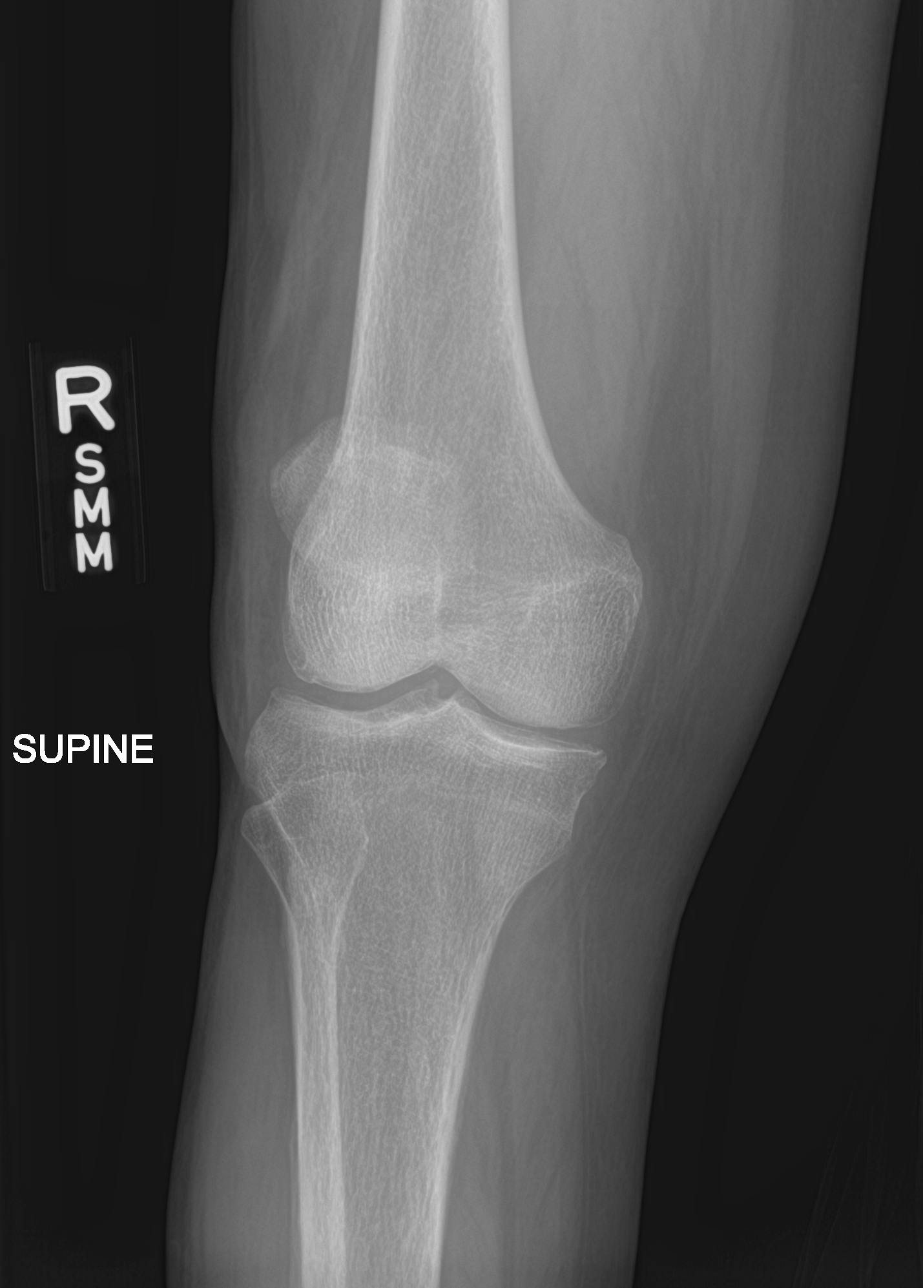

[knee lat]
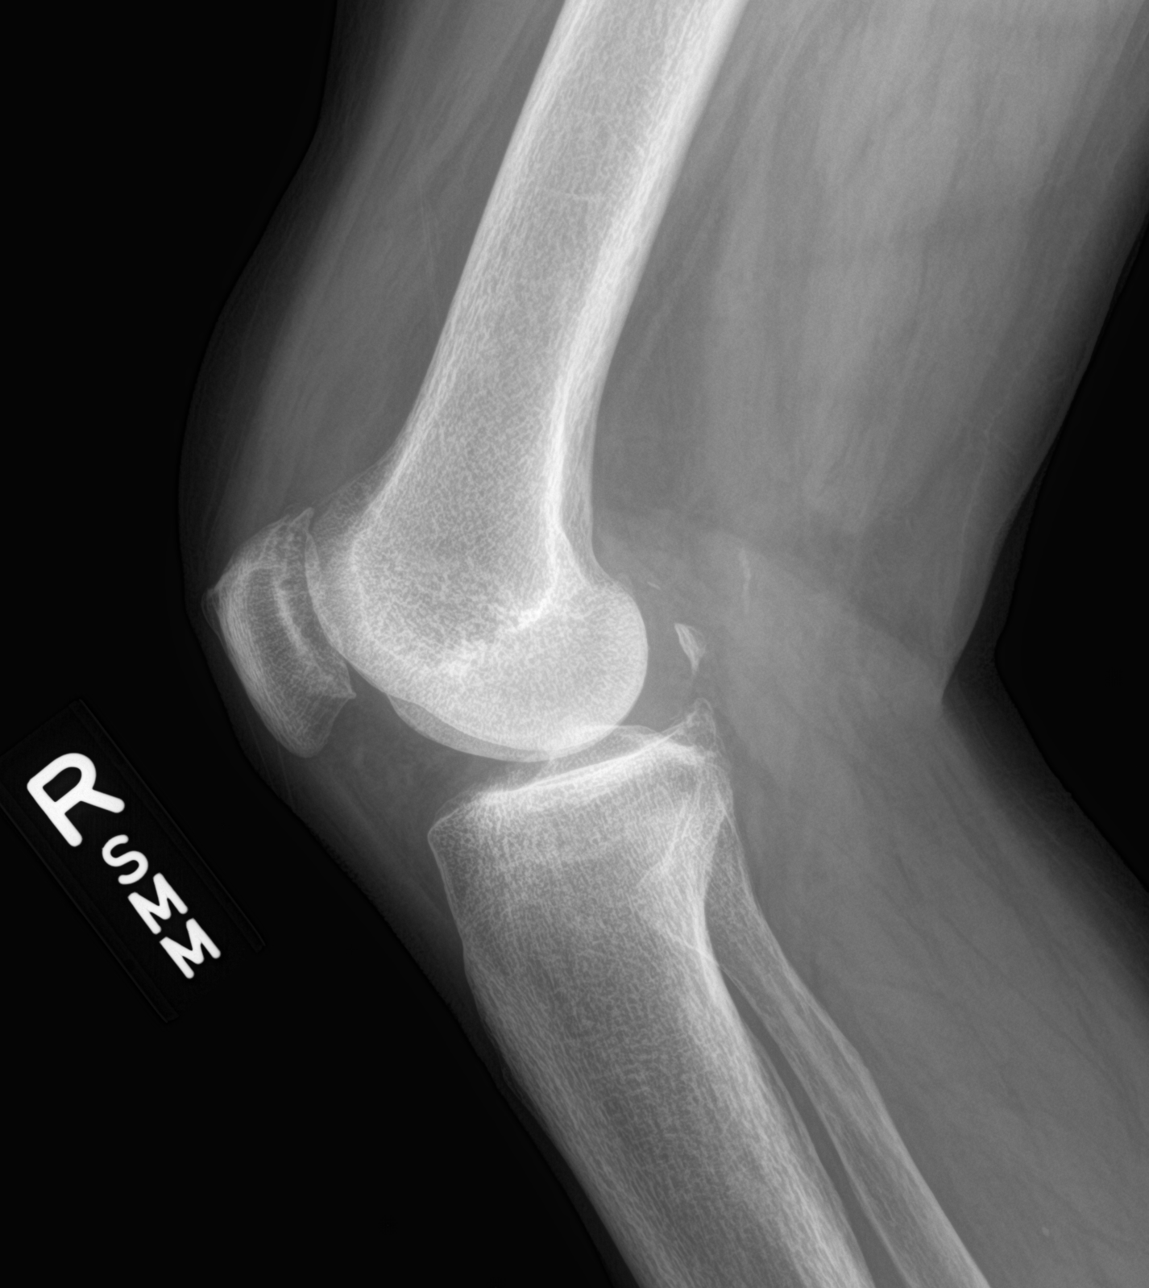

[4 of 4 positions shown; findings below may reference images not displayed]

FINDINGS: No fracture of the proximal tibia or distal femur. Patella is
normal. No joint effusion. Loose bodies posterior to the knee joint.
IMPRESSION: No acute findings the RIGHT knee.

## 2020-11-24 ENCOUNTER — Ambulatory Visit (INDEPENDENT_AMBULATORY_CARE_PROVIDER_SITE_OTHER): Payer: Medicare Other

## 2020-11-24 DIAGNOSIS — I5032 Chronic diastolic (congestive) heart failure: Secondary | ICD-10-CM | POA: Diagnosis not present

## 2020-11-24 LAB — CUP PACEART REMOTE DEVICE CHECK
Battery Remaining Longevity: 100 mo
Battery Voltage: 2.99 V
Brady Statistic AP VP Percent: 11.54 %
Brady Statistic AP VS Percent: 0 %
Brady Statistic AS VP Percent: 88.46 %
Brady Statistic AS VS Percent: 0 %
Brady Statistic RA Percent Paced: 11.52 %
Brady Statistic RV Percent Paced: 100 %
Date Time Interrogation Session: 20220103195701
Implantable Lead Implant Date: 20061024
Implantable Lead Implant Date: 20061024
Implantable Lead Location: 753859
Implantable Lead Location: 753860
Implantable Lead Model: 5076
Implantable Lead Model: 5076
Implantable Pulse Generator Implant Date: 20190104
Lead Channel Impedance Value: 266 Ohm
Lead Channel Impedance Value: 380 Ohm
Lead Channel Impedance Value: 399 Ohm
Lead Channel Impedance Value: 456 Ohm
Lead Channel Pacing Threshold Amplitude: 0.75 V
Lead Channel Pacing Threshold Amplitude: 0.75 V
Lead Channel Pacing Threshold Pulse Width: 0.4 ms
Lead Channel Pacing Threshold Pulse Width: 0.4 ms
Lead Channel Sensing Intrinsic Amplitude: 2 mV
Lead Channel Sensing Intrinsic Amplitude: 2 mV
Lead Channel Setting Pacing Amplitude: 1.5 V
Lead Channel Setting Pacing Amplitude: 2.5 V
Lead Channel Setting Pacing Pulse Width: 0.4 ms
Lead Channel Setting Sensing Sensitivity: 4 mV

## 2020-12-04 ENCOUNTER — Other Ambulatory Visit: Payer: Self-pay

## 2020-12-04 ENCOUNTER — Encounter: Payer: Self-pay | Admitting: Podiatry

## 2020-12-04 ENCOUNTER — Other Ambulatory Visit: Payer: Self-pay | Admitting: Internal Medicine

## 2020-12-04 ENCOUNTER — Ambulatory Visit: Payer: Medicare Other | Admitting: Podiatry

## 2020-12-04 DIAGNOSIS — E119 Type 2 diabetes mellitus without complications: Secondary | ICD-10-CM | POA: Diagnosis not present

## 2020-12-04 NOTE — Progress Notes (Signed)
This patient returns to my office for at risk foot care.  This patient requires this care by a professional since this patient will be at risk due to having diabetes.  She presents today for a diabetic foot exam.   This patient presents for at risk foot care today.  General Appearance  Alert, conversant and in no acute stress.  Vascular  Dorsalis pedis and posterior tibial  pulses are palpable  bilaterally.  Capillary return is within normal limits  bilaterally. Temperature is within normal limits  bilaterally.  Neurologic  Senn-Weinstein monofilament wire test within normal limits  bilaterally. Muscle power within normal limits bilaterally.  Nails Thick disfigured discolored nails with subungual debris  from hallux to fifth toes bilaterally. No evidence of bacterial infection or drainage bilaterally.  Orthopedic  No limitations of motion  feet .  No crepitus or effusions noted.  No bony pathology or digital deformities noted. HAV  B/L.  DJD midfoot  B/L.    Skin  normotropic skin with no porokeratosis noted bilaterally.  No signs of infections or ulcers noted.     Diabetes with no foot complications.  Consent was obtained for treatment procedures.   Diabetic foot exam reveals no vascular or neurologic pathology.  Patient requests nail care in future.   Return office visit    4 months.                 Told patient to return for periodic foot care and evaluation due to potential at risk complications.   [place] Barefoot DPM

## 2020-12-08 NOTE — Progress Notes (Signed)
Remote pacemaker transmission.   

## 2020-12-10 ENCOUNTER — Ambulatory Visit: Payer: Medicare Other

## 2021-01-18 ENCOUNTER — Other Ambulatory Visit: Payer: Self-pay

## 2021-01-18 ENCOUNTER — Encounter: Payer: Self-pay | Admitting: Internal Medicine

## 2021-01-18 ENCOUNTER — Ambulatory Visit: Payer: Medicare Other | Admitting: Internal Medicine

## 2021-01-18 VITALS — BP 124/80 | HR 72 | Ht 59.0 in | Wt 120.0 lb

## 2021-01-18 DIAGNOSIS — Z950 Presence of cardiac pacemaker: Secondary | ICD-10-CM | POA: Diagnosis not present

## 2021-01-18 DIAGNOSIS — I442 Atrioventricular block, complete: Secondary | ICD-10-CM | POA: Diagnosis not present

## 2021-01-18 DIAGNOSIS — I5032 Chronic diastolic (congestive) heart failure: Secondary | ICD-10-CM | POA: Diagnosis not present

## 2021-01-18 DIAGNOSIS — I10 Essential (primary) hypertension: Secondary | ICD-10-CM | POA: Diagnosis not present

## 2021-01-18 NOTE — Progress Notes (Signed)
HPI Mrs. Arciga returns today for followup. She is a pleasant 86 yo woman with CHB, s/p PPM insertion. She has HTN. She has done well in the interim with no chest pain or sob. No edema. No syncope. Her blood sugar was running low and her glipizide was reduced from twice to once daily. Allergies  Allergen Reactions  . Sulfa Drugs Cross Reactors     Swelling, blisters and itching     Current Outpatient Medications  Medication Sig Dispense Refill  . acetaminophen (TYLENOL) 325 MG tablet Take 325 mg by mouth every 6 (six) hours as needed (for pain/headaches.).    Marland Kitchen allopurinol (ZYLOPRIM) 100 MG tablet Take 100 mg by mouth daily.    Marland Kitchen aspirin EC 81 MG tablet Take 81 mg by mouth daily.    . Calcium Carb-Cholecalciferol 500-200 MG-UNIT TABS Take 1 tablet by mouth every other day.    . Cholecalciferol (VITAMIN D3) 2000 units TABS Take 2,000 Units by mouth daily.    . furosemide (LASIX) 40 MG tablet TAKE 1/2 TABLET BY MOUTH EVERY DAY 45 tablet 1  . glipiZIDE (GLUCOTROL XL) 10 MG 24 hr tablet Take 10 mg by mouth daily with breakfast.    . lisinopril (ZESTRIL) 20 MG tablet TAKE 1 TABLET BY MOUTH EVERY DAY 90 tablet 3  . meclizine (ANTIVERT) 25 MG tablet Take 25 mg by mouth 2 (two) times daily as needed for dizziness or nausea.    . metoprolol (LOPRESSOR) 50 MG tablet Take 50 mg by mouth 2 (two) times daily.    . Multiple Vitamin (MULTIVITAMIN WITH MINERALS) TABS tablet Take 1 tablet by mouth daily.    . pioglitazone (ACTOS) 15 MG tablet Take 15 mg by mouth daily.    . simvastatin (ZOCOR) 40 MG tablet Take 40 mg by mouth daily.    . sitaGLIPtan-metformin (JANUMET) 50-1000 MG per tablet Take 1 tablet by mouth 2 (two) times daily.     Current Facility-Administered Medications  Medication Dose Route Frequency Provider Last Rate Last Admin  . 0.9 %  sodium chloride infusion  500 mL Intravenous Continuous Ladene Artist, MD         Past Medical History:  Diagnosis Date  .  Atrioventricular block, complete ([place])   . Diverticulosis   . DM (diabetes mellitus) ([place])   . Hemorrhoids   . HTN (hypertension)   . Hyperlipidemia   . Osteoporosis   . Pacemaker   . Tubular adenoma of colon 03/2010    ROS:   All systems reviewed and negative except as noted in the HPI.   Past Surgical History:  Procedure Laterality Date  . BRAIN BIOPSY  1998   Benign  . CARDIAC CATHETERIZATION  09/12/05   Dr. Jenkins Rouge   . COLONOSCOPY  2013   [facility]ark -polyps  . PACEMAKER INSERTION  09/13/05   Medtronic, dual chamber. Dr. Lovena Le   . PPM GENERATOR CHANGEOUT N/A 11/24/2017   Procedure: PPM GENERATOR CHANGEOUT;  Surgeon: Evans Lance, MD;  Location: [facility];  Service: Cardiovascular;  Laterality: N/A;  . TOTAL ABDOMINAL HYSTERECTOMY W/ BILATERAL SALPINGOOPHORECTOMY    . WISDOM TOOTH EXTRACTION       Family History  Problem Relation Age of Onset  . Diabetes Father   . Lung cancer Father   . Colon cancer Neg Hx   . Colon polyps Neg Hx   . Esophageal cancer Neg Hx   . Rectal cancer Neg Hx   .  Stomach cancer Neg Hx   . Breast cancer Neg Hx      Social History   Socioeconomic History  . Marital status: Married    Spouse name: Not on file  . Number of children: 1  . Years of education: Not on file  . Highest education level: Not on file  Occupational History  . Occupation: Retired from [employer] store    Employer: RETIRED  Tobacco Use  . Smoking status: Never Smoker  . Smokeless tobacco: Never Used  . Tobacco comment: tobacco use - no  Vaping Use  . Vaping Use: Never used  Substance and Sexual Activity  . Alcohol use: No  . Drug use: No  . Sexual activity: Not on file    Comment: Hysterectomy  Other Topics Concern  . Not on file  Social History Narrative   Lives in [place] with her husband.    Social Determinants of Health   Financial Resource Strain: Not on file  Food Insecurity: Not on file  Transportation Needs: Not on file  Physical  Activity: Not on file  Stress: Not on file  Social Connections: Not on file  Intimate Partner Violence: Not on file     BP 124/80   Pulse 72   Ht 4\' 11"  (1.499 m)   Wt 120 lb (54.4 kg)   SpO2 98%   BMI 24.24 kg/m   Physical Exam:  Well appearing NAD HEENT: Unremarkable Neck:  No JVD, no thyromegally Lymphatics:  No adenopathy Back:  No CVA tenderness Lungs:  Clear with no wheezes HEART:  Regular rate rhythm, no murmurs, no rubs, no clicks Abd:  soft, positive bowel sounds, no organomegally, no rebound, no guarding Ext:  2 plus pulses, no edema, no cyanosis, no clubbing Skin:  No rashes no nodules Neuro:  CN II through XII intact, motor grossly intact  EKG - nsr with ventricular pacing  DEVICE  Normal device function.  See PaceArt for details.   Assess/Plan: 1. CHB- she is asymptomatic, s/p PPM insertion.  2. PPM - her medtronic DDD PM is working.  No change in her programming today. 8 years of battery longevity. 3. HTN - her bp is well controlled. We will follow and continue her current meds. 4. Chronic diastolic heart failure - her symptoms are class 2. She will continue a low sodium diet and she will continue her current meds.  Carleene Overlie Taylor,MD

## 2021-01-18 NOTE — Patient Instructions (Signed)
Medication Instructions:  Your physician recommends that you continue on your current medications as directed. Please refer to the Current Medication list given to you today.  Labwork: None ordered.  Testing/Procedures: None ordered.  Follow-Up: Your physician wants you to follow-up in: one year with Cristopher Peru, MD or one of the following Advanced Practice Providers on your designated Care Team:    Chanetta [place], NP  Tommye Standard, PA-C  Legrand Como "Jonni Sanger" [place], [place]  Remote monitoring is used to monitor your Pacemaker from home. This monitoring reduces the number of office visits required to check your device to one time per year. It allows [place] to keep an eye on the functioning of your device to ensure it is working properly. You are scheduled for a device check from home on 02/23/2021. You may send your transmission at any time that day. If you have a wireless device, the transmission will be sent automatically. After your physician reviews your transmission, you will receive a postcard with your next transmission date.  Any Other Special Instructions Will Be Listed Below (If Applicable).  If you need a refill on your cardiac medications before your next appointment, please call your pharmacy.

## 2021-01-21 ENCOUNTER — Other Ambulatory Visit: Payer: Self-pay

## 2021-01-21 ENCOUNTER — Ambulatory Visit
Admission: RE | Admit: 2021-01-21 | Discharge: 2021-01-21 | Disposition: A | Discharge status: Home / Self Care | Payer: Medicare Other | Source: Ambulatory Visit | Attending: Internal Medicine | Admitting: Internal Medicine

## 2021-01-21 DIAGNOSIS — Z1231 Encounter for screening mammogram for malignant neoplasm of breast: Secondary | ICD-10-CM

## 2021-02-18 ENCOUNTER — Other Ambulatory Visit: Payer: Self-pay | Admitting: Internal Medicine

## 2021-02-23 ENCOUNTER — Ambulatory Visit (INDEPENDENT_AMBULATORY_CARE_PROVIDER_SITE_OTHER): Payer: Medicare Other

## 2021-02-23 DIAGNOSIS — I442 Atrioventricular block, complete: Secondary | ICD-10-CM

## 2021-02-23 LAB — CUP PACEART REMOTE DEVICE CHECK
Battery Remaining Longevity: 94 mo
Battery Voltage: 2.99 V
Brady Statistic AP VP Percent: 2.68 %
Brady Statistic AP VS Percent: 0 %
Brady Statistic AS VP Percent: 97.31 %
Brady Statistic AS VS Percent: 0 %
Brady Statistic RA Percent Paced: 2.67 %
Brady Statistic RV Percent Paced: 100 %
Date Time Interrogation Session: 20220404204901
Implantable Lead Implant Date: 20061024
Implantable Lead Implant Date: 20061024
Implantable Lead Location: 753859
Implantable Lead Location: 753860
Implantable Lead Model: 5076
Implantable Lead Model: 5076
Implantable Pulse Generator Implant Date: 20190104
Lead Channel Impedance Value: 247 Ohm
Lead Channel Impedance Value: 361 Ohm
Lead Channel Impedance Value: 380 Ohm
Lead Channel Impedance Value: 418 Ohm
Lead Channel Pacing Threshold Amplitude: 0.75 V
Lead Channel Pacing Threshold Amplitude: 0.875 V
Lead Channel Pacing Threshold Pulse Width: 0.4 ms
Lead Channel Pacing Threshold Pulse Width: 0.4 ms
Lead Channel Sensing Intrinsic Amplitude: 1.875 mV
Lead Channel Sensing Intrinsic Amplitude: 1.875 mV
Lead Channel Setting Pacing Amplitude: 1.5 V
Lead Channel Setting Pacing Amplitude: 2.5 V
Lead Channel Setting Pacing Pulse Width: 0.4 ms
Lead Channel Setting Sensing Sensitivity: 4 mV

## 2021-03-05 NOTE — Progress Notes (Signed)
Remote pacemaker transmission.   

## 2021-04-02 ENCOUNTER — Ambulatory Visit: Payer: Medicare Other | Admitting: Podiatry

## 2021-04-26 ENCOUNTER — Other Ambulatory Visit: Payer: Self-pay | Admitting: Internal Medicine

## 2021-05-25 ENCOUNTER — Ambulatory Visit (INDEPENDENT_AMBULATORY_CARE_PROVIDER_SITE_OTHER): Payer: Medicare Other

## 2021-05-25 DIAGNOSIS — I442 Atrioventricular block, complete: Secondary | ICD-10-CM

## 2021-05-25 LAB — CUP PACEART REMOTE DEVICE CHECK
Battery Remaining Longevity: 94 mo
Battery Voltage: 2.98 V
Brady Statistic AP VP Percent: 6.43 %
Brady Statistic AP VS Percent: 0 %
Brady Statistic AS VP Percent: 93.57 %
Brady Statistic AS VS Percent: 0 %
Brady Statistic RA Percent Paced: 6.42 %
Brady Statistic RV Percent Paced: 100 %
Date Time Interrogation Session: 20220704055037
Implantable Lead Implant Date: 20061024
Implantable Lead Implant Date: 20061024
Implantable Lead Location: 753859
Implantable Lead Location: 753860
Implantable Lead Model: 5076
Implantable Lead Model: 5076
Implantable Pulse Generator Implant Date: 20190104
Lead Channel Impedance Value: 247 Ohm
Lead Channel Impedance Value: 361 Ohm
Lead Channel Impedance Value: 399 Ohm
Lead Channel Impedance Value: 456 Ohm
Lead Channel Pacing Threshold Amplitude: 0.75 V
Lead Channel Pacing Threshold Amplitude: 0.875 V
Lead Channel Pacing Threshold Pulse Width: 0.4 ms
Lead Channel Pacing Threshold Pulse Width: 0.4 ms
Lead Channel Sensing Intrinsic Amplitude: 1.875 mV
Lead Channel Sensing Intrinsic Amplitude: 1.875 mV
Lead Channel Setting Pacing Amplitude: 1.5 V
Lead Channel Setting Pacing Amplitude: 2.5 V
Lead Channel Setting Pacing Pulse Width: 0.4 ms
Lead Channel Setting Sensing Sensitivity: 4 mV

## 2021-06-11 NOTE — Progress Notes (Signed)
Remote pacemaker transmission.   

## 2021-08-24 ENCOUNTER — Ambulatory Visit (INDEPENDENT_AMBULATORY_CARE_PROVIDER_SITE_OTHER): Payer: Medicare Other

## 2021-08-24 DIAGNOSIS — I442 Atrioventricular block, complete: Secondary | ICD-10-CM

## 2021-08-24 LAB — CUP PACEART REMOTE DEVICE CHECK
Battery Remaining Longevity: 89 mo
Battery Voltage: 2.98 V
Brady Statistic AP VP Percent: 7.89 %
Brady Statistic AP VS Percent: 0 %
Brady Statistic AS VP Percent: 92.11 %
Brady Statistic AS VS Percent: 0 %
Brady Statistic RA Percent Paced: 7.87 %
Brady Statistic RV Percent Paced: 100 %
Date Time Interrogation Session: 20221003023814
Implantable Lead Implant Date: 20061024
Implantable Lead Implant Date: 20061024
Implantable Lead Location: 753859
Implantable Lead Location: 753860
Implantable Lead Model: 5076
Implantable Lead Model: 5076
Implantable Pulse Generator Implant Date: 20190104
Lead Channel Impedance Value: 247 Ohm
Lead Channel Impedance Value: 361 Ohm
Lead Channel Impedance Value: 380 Ohm
Lead Channel Impedance Value: 437 Ohm
Lead Channel Pacing Threshold Amplitude: 0.75 V
Lead Channel Pacing Threshold Amplitude: 0.875 V
Lead Channel Pacing Threshold Pulse Width: 0.4 ms
Lead Channel Pacing Threshold Pulse Width: 0.4 ms
Lead Channel Sensing Intrinsic Amplitude: 2 mV
Lead Channel Sensing Intrinsic Amplitude: 2 mV
Lead Channel Setting Pacing Amplitude: 1.5 V
Lead Channel Setting Pacing Amplitude: 2.5 V
Lead Channel Setting Pacing Pulse Width: 0.4 ms
Lead Channel Setting Sensing Sensitivity: 4 mV

## 2021-09-01 NOTE — Progress Notes (Signed)
Remote pacemaker transmission.   

## 2021-11-22 LAB — CUP PACEART REMOTE DEVICE CHECK
Battery Remaining Longevity: 87 mo
Battery Voltage: 2.98 V
Brady Statistic AP VP Percent: 2.95 %
Brady Statistic AP VS Percent: 0 %
Brady Statistic AS VP Percent: 97.05 %
Brady Statistic AS VS Percent: 0 %
Brady Statistic RA Percent Paced: 2.94 %
Brady Statistic RV Percent Paced: 100 %
Date Time Interrogation Session: 20230101201042
Implantable Lead Implant Date: 20061024
Implantable Lead Implant Date: 20061024
Implantable Lead Location: 753859
Implantable Lead Location: 753860
Implantable Lead Model: 5076
Implantable Lead Model: 5076
Implantable Pulse Generator Implant Date: 20190104
Lead Channel Impedance Value: 247 Ohm
Lead Channel Impedance Value: 361 Ohm
Lead Channel Impedance Value: 418 Ohm
Lead Channel Impedance Value: 475 Ohm
Lead Channel Pacing Threshold Amplitude: 0.75 V
Lead Channel Pacing Threshold Amplitude: 0.875 V
Lead Channel Pacing Threshold Pulse Width: 0.4 ms
Lead Channel Pacing Threshold Pulse Width: 0.4 ms
Lead Channel Sensing Intrinsic Amplitude: 2.25 mV
Lead Channel Sensing Intrinsic Amplitude: 2.25 mV
Lead Channel Setting Pacing Amplitude: 1.5 V
Lead Channel Setting Pacing Amplitude: 2.5 V
Lead Channel Setting Pacing Pulse Width: 0.4 ms
Lead Channel Setting Sensing Sensitivity: 4 mV

## 2021-11-23 ENCOUNTER — Ambulatory Visit (INDEPENDENT_AMBULATORY_CARE_PROVIDER_SITE_OTHER): Payer: Medicare Other

## 2021-11-23 ENCOUNTER — Other Ambulatory Visit: Payer: Self-pay | Admitting: Internal Medicine

## 2021-11-23 DIAGNOSIS — I5032 Chronic diastolic (congestive) heart failure: Secondary | ICD-10-CM | POA: Diagnosis not present

## 2021-11-28 ENCOUNTER — Ambulatory Visit
Admission: EM | Admit: 2021-11-28 | Discharge: 2021-11-28 | Disposition: A | Discharge status: Home / Self Care | Payer: Medicare Other | Attending: Physician Assistant | Admitting: Physician Assistant

## 2021-11-28 ENCOUNTER — Other Ambulatory Visit: Payer: Self-pay

## 2021-11-28 ENCOUNTER — Encounter: Payer: Self-pay | Admitting: Emergency Medicine

## 2021-11-28 DIAGNOSIS — M542 Cervicalgia: Secondary | ICD-10-CM

## 2021-11-28 MED ORDER — TIZANIDINE HCL 4 MG PO TABS: 2.0000 mg | ORAL_TABLET | Freq: Four times a day (QID) | ORAL | 0 refills | Status: AC | PRN

## 2021-11-28 NOTE — ED Provider Notes (Signed)
[facility]    CSN: [id number] Arrival date & time: 11/28/21  0909      History   Chief Complaint Chief Complaint  Patient presents with   Neck Pain    HPI [name] is a 87 y.o. female.   Patient here today for evaluation of left-sided neck pain that started 3 days ago.  She states that she feels as if the area is cramping.  Movement makes pain worse.  She states pain is also worse at nighttime.  She does report that she did just get a new bed and that she has been elevating the bed and is not sure if this is what caused symptoms.  She does not report any treatment for symptoms.  The history is provided by the patient.  Neck Pain Associated symptoms: no fever and no numbness    Past Medical History:  Diagnosis Date   Atrioventricular block, complete ([place])    Diverticulosis    DM (diabetes mellitus) ([place])    Hemorrhoids    HTN (hypertension)    Hyperlipidemia    Osteoporosis    Pacemaker    Tubular adenoma of colon 03/2010    Patient Active Problem List   Diagnosis Date Noted   Diabetes mellitus without complication ([place]) 53/61/4431   Chronic diastolic heart failure ([place]) 01/17/2020   Pulmonary hypertension, unspecified ([place]) 01/15/2018   Gout 12/04/2015   Leg cramps 11/29/2013   DM 05/05/2009   Hyperlipidemia 05/05/2009   Essential hypertension 05/05/2009   ATRIOVENTRICULAR BLOCK, COMPLETE 05/05/2009   PACEMAKER, PERMANENT 05/05/2009    Past Surgical History:  Procedure Laterality Date   BRAIN BIOPSY  1998   Benign   CARDIAC CATHETERIZATION  09/12/05   Dr. Jenkins Rouge    COLONOSCOPY  2013   [facility] -polyps   PACEMAKER INSERTION  09/13/05   Medtronic, dual chamber. Dr. Lovena Le    [facility] GENERATOR CHANGEOUT N/A 11/24/2017   Procedure: PPM GENERATOR CHANGEOUT;  Surgeon: Evans Lance, MD;  Location: [place] [facility];  Service: Cardiovascular;  Laterality: N/A;   TOTAL ABDOMINAL HYSTERECTOMY W/ BILATERAL SALPINGOOPHORECTOMY     WISDOM  TOOTH EXTRACTION      OB History   No obstetric history on file.      Home Medications    Prior to Admission medications   Medication Sig Start Date End Date Taking? Authorizing Provider  tiZANidine (ZANAFLEX) 4 MG tablet Take 0.5 tablets (2 mg total) by mouth every 6 (six) hours as needed for up to 5 days for muscle spasms. 11/28/21 12/03/21 Yes Francene Finders, PA-C  acetaminophen (TYLENOL) 325 MG tablet Take 325 mg by mouth every 6 (six) hours as needed (for pain/headaches.).    [provider]  allopurinol (ZYLOPRIM) 100 MG tablet Take 100 mg by mouth daily. 04/02/20   [provider]  aspirin EC 81 MG tablet Take 81 mg by mouth daily.    [provider]  Calcium Carb-Cholecalciferol 500-200 MG-UNIT TABS Take 1 tablet by mouth every other day.    [provider]  Cholecalciferol (VITAMIN D3) 2000 units TABS Take 2,000 Units by mouth daily.    [provider]  furosemide (LASIX) 40 MG tablet Take 0.5 tablets (20 mg total) by mouth daily. Pt needs to make appt with provider for further refills - 1st attempt 11/23/21   Evans Lance, MD  glipiZIDE (GLUCOTROL XL) 10 MG 24 hr tablet Take 10 mg by mouth daily with breakfast.    [provider]  lisinopril (ZESTRIL) 20 MG tablet TAKE 1 TABLET BY MOUTH EVERY DAY 02/18/21   Evans Lance, MD  meclizine (ANTIVERT) 25 MG tablet Take 25 mg by mouth 2 (two) times daily as needed for dizziness or nausea.    [provider]  metoprolol (LOPRESSOR) 50 MG tablet Take 50 mg by mouth 2 (two) times daily.    [provider]  Multiple Vitamin (MULTIVITAMIN WITH MINERALS) TABS tablet Take 1 tablet by mouth daily.    [provider]  pioglitazone (ACTOS) 15 MG tablet Take 15 mg by mouth daily.    [provider]  simvastatin (ZOCOR) 40 MG tablet Take 40 mg by mouth daily.    [provider]  sitaGLIPtan-metformin (JANUMET) 50-1000 MG per tablet Take 1 tablet by  mouth 2 (two) times daily.    [provider]    Family History Family History  Problem Relation Age of Onset   Diabetes Father    Lung cancer Father    Colon cancer Neg Hx    Colon polyps Neg Hx    Esophageal cancer Neg Hx    Rectal cancer Neg Hx    Stomach cancer Neg Hx    Breast cancer Neg Hx     Social History Social History   Tobacco Use   Smoking status: Never   Smokeless tobacco: Never   Tobacco comments:    tobacco use - no  Vaping Use   Vaping Use: Never used  Substance Use Topics   Alcohol use: No   Drug use: No     Allergies   Sulfa drugs cross reactors   Review of Systems Review of Systems  Constitutional:  Negative for chills and fever.  Eyes:  Negative for discharge and redness.  Respiratory:  Negative for shortness of breath.   Gastrointestinal:  Negative for abdominal pain, nausea and vomiting.  Musculoskeletal:  Positive for myalgias and neck pain.  Neurological:  Negative for numbness.    Physical Exam Triage Vital Signs ED Triage Vitals [11/28/21 0946]  Enc Vitals Group     BP (!) 168/76     Pulse Rate 76     Resp 16     Temp 98.7 F (37.1 C)     Temp Source Oral     SpO2 97 %     Weight      Height      Head Circumference      Peak Flow      Pain Score 8     Pain Loc      Pain Edu?      Excl. in [place]?    No data found.  Updated Vital Signs BP (!) 168/76 (BP Location: Right Arm) Comment: has not had bp meds today.   Pulse 76    Temp 98.7 F (37.1 C) (Oral)    Resp 16    SpO2 97%      Physical Exam Vitals and nursing note reviewed.  Constitutional:      General: She is not in acute distress.    Appearance: Normal appearance. She is not ill-appearing.  HENT:     Head: Normocephalic and atraumatic.  Eyes:     Conjunctiva/sclera: Conjunctivae normal.  Cardiovascular:     Rate and Rhythm: Normal rate.  Pulmonary:     Effort: Pulmonary effort is normal.  Musculoskeletal:     Comments: Mildly decreased ROM of  neck due to pain, mild TTP to left trapezius  Neurological:  Mental Status: She is alert.  Psychiatric:        Mood and Affect: Mood normal.        Behavior: Behavior normal.        Thought Content: Thought content normal.     UC Treatments / Results  Labs (all labs ordered are listed, but only abnormal results are displayed) Labs Reviewed - No data to display  EKG   Radiology No results found.  Procedures Procedures (including critical care time)  Medications Ordered in UC Medications - No data to display  Initial Impression / Assessment and Plan / UC Course  I have reviewed the triage vital signs and the nursing notes.  Pertinent labs & imaging results that were available during my care of the patient were reviewed by me and considered in my medical decision making (see chart for details).    Suspect likely muscle strain. Will trial low dose muscle relaxer and advised to use with caution as it may cause drowsiness. Encouraged heat as well. Recommended follow up with any further concerns.   Final Clinical Impressions(s) / UC Diagnoses   Final diagnoses:  Neck pain   Discharge Instructions   None    ED Prescriptions     Medication Sig Dispense Auth. Provider   tiZANidine (ZANAFLEX) 4 MG tablet Take 0.5 tablets (2 mg total) by mouth every 6 (six) hours as needed for up to 5 days for muscle spasms. 10 tablet Francene Finders, PA-C      PDMP not reviewed this encounter.   Francene Finders, PA-C 11/28/21 1118

## 2021-11-28 NOTE — ED Triage Notes (Signed)
Pt said x 3 days has had a cramp in her left side of neck and shoulder area. Pt said she did get a new bed that help with her posture, that she elevates the bed. Pt said when she lays down it throbs more.

## 2021-12-01 NOTE — Progress Notes (Signed)
Remote pacemaker transmission.   

## 2021-12-02 ENCOUNTER — Other Ambulatory Visit: Payer: Self-pay | Admitting: Internal Medicine

## 2021-12-02 DIAGNOSIS — Z1231 Encounter for screening mammogram for malignant neoplasm of breast: Secondary | ICD-10-CM

## 2022-01-24 ENCOUNTER — Ambulatory Visit: Payer: Medicare Other

## 2022-01-25 ENCOUNTER — Other Ambulatory Visit: Payer: Self-pay | Admitting: Internal Medicine

## 2022-01-27 ENCOUNTER — Ambulatory Visit
Admission: RE | Admit: 2022-01-27 | Discharge: 2022-01-27 | Disposition: A | Discharge status: Home / Self Care | Payer: Medicare Other | Source: Ambulatory Visit | Attending: Internal Medicine | Admitting: Internal Medicine

## 2022-01-27 DIAGNOSIS — Z1231 Encounter for screening mammogram for malignant neoplasm of breast: Secondary | ICD-10-CM

## 2022-02-01 ENCOUNTER — Other Ambulatory Visit: Payer: Self-pay | Admitting: Internal Medicine

## 2022-02-02 ENCOUNTER — Other Ambulatory Visit: Payer: Self-pay

## 2022-02-02 MED ORDER — FUROSEMIDE 40 MG PO TABS: 20.0000 mg | ORAL_TABLET | Freq: Every day | ORAL | 1 refills | Status: DC

## 2022-02-02 NOTE — Progress Notes (Signed)
Refilled metroprolol ? ?

## 2022-02-18 ENCOUNTER — Other Ambulatory Visit: Payer: Self-pay | Admitting: Internal Medicine

## 2022-02-22 ENCOUNTER — Ambulatory Visit (INDEPENDENT_AMBULATORY_CARE_PROVIDER_SITE_OTHER): Payer: Medicare Other

## 2022-02-22 DIAGNOSIS — I442 Atrioventricular block, complete: Secondary | ICD-10-CM

## 2022-02-22 LAB — CUP PACEART REMOTE DEVICE CHECK
Battery Remaining Longevity: 81 mo
Battery Voltage: 2.98 V
Brady Statistic AP VP Percent: 3.41 %
Brady Statistic AP VS Percent: 0 %
Brady Statistic AS VP Percent: 96.59 %
Brady Statistic AS VS Percent: 0 %
Brady Statistic RA Percent Paced: 3.4 %
Brady Statistic RV Percent Paced: 100 %
Date Time Interrogation Session: 20230404005837
Implantable Lead Implant Date: 20061024
Implantable Lead Implant Date: 20061024
Implantable Lead Location: 753859
Implantable Lead Location: 753860
Implantable Lead Model: 5076
Implantable Lead Model: 5076
Implantable Pulse Generator Implant Date: 20190104
Lead Channel Impedance Value: 266 Ohm
Lead Channel Impedance Value: 361 Ohm
Lead Channel Impedance Value: 380 Ohm
Lead Channel Impedance Value: 437 Ohm
Lead Channel Pacing Threshold Amplitude: 0.75 V
Lead Channel Pacing Threshold Amplitude: 0.875 V
Lead Channel Pacing Threshold Pulse Width: 0.4 ms
Lead Channel Pacing Threshold Pulse Width: 0.4 ms
Lead Channel Sensing Intrinsic Amplitude: 2.25 mV
Lead Channel Sensing Intrinsic Amplitude: 2.25 mV
Lead Channel Setting Pacing Amplitude: 1.75 V
Lead Channel Setting Pacing Amplitude: 2.5 V
Lead Channel Setting Pacing Pulse Width: 0.4 ms
Lead Channel Setting Sensing Sensitivity: 4 mV

## 2022-03-08 NOTE — Progress Notes (Signed)
Remote pacemaker transmission.   

## 2022-03-11 ENCOUNTER — Ambulatory Visit: Payer: Medicare Other | Admitting: Internal Medicine

## 2022-03-11 ENCOUNTER — Encounter: Payer: Self-pay | Admitting: Internal Medicine

## 2022-03-11 VITALS — BP 128/82 | HR 80 | Ht 59.0 in | Wt 118.2 lb

## 2022-03-11 DIAGNOSIS — I5032 Chronic diastolic (congestive) heart failure: Secondary | ICD-10-CM | POA: Diagnosis not present

## 2022-03-11 DIAGNOSIS — I442 Atrioventricular block, complete: Secondary | ICD-10-CM | POA: Diagnosis not present

## 2022-03-11 DIAGNOSIS — Z950 Presence of cardiac pacemaker: Secondary | ICD-10-CM

## 2022-03-11 DIAGNOSIS — I10 Essential (primary) hypertension: Secondary | ICD-10-CM | POA: Diagnosis not present

## 2022-03-11 LAB — CUP PACEART INCLINIC DEVICE CHECK
Date Time Interrogation Session: 20230421141614
Implantable Lead Implant Date: 20061024
Implantable Lead Implant Date: 20061024
Implantable Lead Location: 753859
Implantable Lead Location: 753860
Implantable Lead Model: 5076
Implantable Lead Model: 5076
Implantable Pulse Generator Implant Date: 20190104

## 2022-03-11 NOTE — Progress Notes (Signed)
? ? ? ? ?HPI ?[name] returns today for followup. She is a pleasant 87 yo woman with CHB, s/p PPM insertion. She has HTN. She has done well in the interim with no chest pain or sob. No edema. No syncope. Her blood sugar was running low and her glipizide was reduced from twice to once daily. ?Allergies  ?Allergen Reactions  ? Sulfa Drugs Cross Reactors   ?  Swelling, blisters and itching  ? ? ? ?Current Outpatient Medications  ?Medication Sig Dispense Refill  ? acetaminophen (TYLENOL) 325 MG tablet Take 325 mg by mouth every 6 (six) hours as needed (for pain/headaches.).    ? allopurinol (ZYLOPRIM) 100 MG tablet Take 100 mg by mouth daily.    ? aspirin EC 81 MG tablet Take 81 mg by mouth daily.    ? Calcium Carb-Cholecalciferol 500-200 MG-UNIT TABS Take 1 tablet by mouth every other day.    ? Cholecalciferol (VITAMIN D3) 2000 units TABS Take 2,000 Units by mouth daily.    ? furosemide (LASIX) 40 MG tablet TAKE 1/2 TABLET BY MOUTH DAILY 45 tablet 1  ? furosemide (LASIX) 40 MG tablet Take 0.5 tablets (20 mg total) by mouth daily. 15 tablet 1  ? glipiZIDE (GLUCOTROL XL) 10 MG 24 hr tablet Take 10 mg by mouth daily with breakfast.    ? lisinopril (ZESTRIL) 20 MG tablet TAKE 1 TABLET BY MOUTH EVERY DAY 90 tablet 0  ? meclizine (ANTIVERT) 25 MG tablet Take 25 mg by mouth 2 (two) times daily as needed for dizziness or nausea.    ? metoprolol (LOPRESSOR) 50 MG tablet Take 50 mg by mouth 2 (two) times daily.    ? Multiple Vitamin (MULTIVITAMIN WITH MINERALS) TABS tablet Take 1 tablet by mouth daily.    ? pioglitazone (ACTOS) 15 MG tablet Take 15 mg by mouth daily.    ? simvastatin (ZOCOR) 40 MG tablet Take 40 mg by mouth daily.    ? sitaGLIPtan-metformin (JANUMET) 50-1000 MG per tablet Take 1 tablet by mouth 2 (two) times daily.    ? ?Current Facility-Administered Medications  ?Medication Dose Route Frequency Provider Last Rate Last Admin  ? 0.9 %  sodium chloride infusion  500 mL Intravenous Continuous Ladene Artist, MD      ? ? ? ?Past Medical History:  ?Diagnosis Date  ? Atrioventricular block, complete ([facility])   ? Diverticulosis   ? DM (diabetes mellitus) ([place])   ? Hemorrhoids   ? HTN (hypertension)   ? Hyperlipidemia   ? Osteoporosis   ? Pacemaker   ? Tubular adenoma of colon 03/2010  ? ? ?ROS: ? ? All systems reviewed and negative except as noted in the HPI. ? ? ?Past Surgical History:  ?Procedure Laterality Date  ? BRAIN BIOPSY  1998  ? Benign  ? CARDIAC CATHETERIZATION  09/12/05  ? Dr. Jenkins Rouge   ? COLONOSCOPY  2013  ? [facility] Plan -polyps  ? PACEMAKER INSERTION  09/13/05  ? Medtronic, dual chamber. Dr. Lovena Le   ? PPM GENERATOR CHANGEOUT N/A 11/24/2017  ? Procedure: PPM GENERATOR CHANGEOUT;  Surgeon: Evans Lance, MD;  Location: [place] [facility];  Service: Cardiovascular;  Laterality: N/A;  ? TOTAL ABDOMINAL HYSTERECTOMY W/ BILATERAL SALPINGOOPHORECTOMY    ? WISDOM TOOTH EXTRACTION    ? ? ? ?Family History  ?Problem Relation Age of Onset  ? Diabetes Father   ? Lung cancer Father   ? Colon cancer Neg Hx   ? Colon polyps Neg  Hx   ? Esophageal cancer Neg Hx   ? Rectal cancer Neg Hx   ? Stomach cancer Neg Hx   ? Breast cancer Neg Hx   ? ? ? ?Social History  ? ?Socioeconomic History  ? Marital status: Married  ?  Spouse name: Not on file  ? Number of children: 1  ? Years of education: Not on file  ? Highest education level: Not on file  ?Occupational History  ? Occupation: Retired from [employer]  ?  Employer: RETIRED  ?Tobacco Use  ? Smoking status: Never  ? Smokeless tobacco: Never  ? Tobacco comments:  ?  tobacco use - no  ?Vaping Use  ? Vaping Use: Never used  ?Substance and Sexual Activity  ? Alcohol use: No  ? Drug use: No  ? Sexual activity: Not on file  ?  Comment: Hysterectomy  ?Other Topics Concern  ? Not on file  ?Social History Narrative  ? Lives in [place] with her husband.   ? ?Social Determinants of Health  ? ?Financial Resource Strain: Not on file  ?Food Insecurity: Not on file  ?Transportation Needs: Not on file   ?Physical Activity: Not on file  ?Stress: Not on file  ?Social Connections: Not on file  ?Intimate Partner Violence: Not on file  ? ? ? ?There were no vitals taken for this visit. ? ?Physical Exam: ? ?Well appearing NAD ?HEENT: Unremarkable ?Neck:  No JVD, no thyromegally ?Lymphatics:  No adenopathy ?Back:  No CVA tenderness ?Lungs:  Clear ?HEART:  Regular rate rhythm, no murmurs, no rubs, no clicks ?Abd:  soft, positive bowel sounds, no organomegally, no rebound, no guarding ?Ext:  2 plus pulses, no edema, no cyanosis, no clubbing ?Skin:  No rashes no nodules ?Neuro:  CN II through XII intact, motor grossly intact ? ?EKG - NSR with P. Synchronous ventricular pacing ? ?DEVICE  ?Normal device function.  See PaceArt for details.  ? ?Assess/Plan:  ?1. CHB- she is asymptomatic, s/p PPM insertion.  ?2. PPM - her medtronic DDD PM is working.  No change in her programming today. 6-7 years of battery longevity. ?3. HTN - her bp is well controlled. We will follow and continue her current meds. ?4. Chronic diastolic heart failure - her symptoms are class 2. She will continue a low sodium diet and she will continue her current meds. ?  ?Carleene Overlie Levander Katzenstein,MD ?

## 2022-03-11 NOTE — Patient Instructions (Signed)
Medication Instructions:  ?Your physician recommends that you continue on your current medications as directed. Please refer to the Current Medication list given to you today. ? ?Labwork: ?None ordered. ? ?Testing/Procedures: ?None ordered. ? ?Follow-Up: ?Your physician wants you to follow-up in: one year with Cristopher Peru, MD or one of the following Advanced Practice Providers on your designated Care Team:   ?Tommye Standard, PA-C ?Legrand Como "Jonni Sanger" [place], PA-C ? ?Remote monitoring is used to monitor your Pacemaker from home. This monitoring reduces the number of office visits required to check your device to one time per year. It allows [place] to keep an eye on the functioning of your device to ensure it is working properly. You are scheduled for a device check from home on 05/25/2022. You may send your transmission at any time that day. If you have a wireless device, the transmission will be sent automatically. After your physician reviews your transmission, you will receive a postcard with your next transmission date. ? ?Any Other Special Instructions Will Be Listed Below (If Applicable). ? ?If you need a refill on your cardiac medications before your next appointment, please call your pharmacy.  ? ?Important Information About Sugar ? ? ? ? ? ? ? ?

## 2022-05-24 ENCOUNTER — Other Ambulatory Visit: Payer: Self-pay | Admitting: Internal Medicine

## 2022-05-25 ENCOUNTER — Ambulatory Visit (INDEPENDENT_AMBULATORY_CARE_PROVIDER_SITE_OTHER): Payer: Medicare Other

## 2022-05-25 DIAGNOSIS — Z950 Presence of cardiac pacemaker: Secondary | ICD-10-CM

## 2022-05-26 LAB — CUP PACEART REMOTE DEVICE CHECK
Battery Remaining Longevity: 76 mo
Battery Voltage: 2.97 V
Brady Statistic AP VP Percent: 6.95 %
Brady Statistic AP VS Percent: 0 %
Brady Statistic AS VP Percent: 93.05 %
Brady Statistic AS VS Percent: 0 %
Brady Statistic RA Percent Paced: 6.93 %
Brady Statistic RV Percent Paced: 100 %
Date Time Interrogation Session: 20230704020723
Implantable Lead Implant Date: 20061024
Implantable Lead Implant Date: 20061024
Implantable Lead Location: 753859
Implantable Lead Location: 753860
Implantable Lead Model: 5076
Implantable Lead Model: 5076
Implantable Pulse Generator Implant Date: 20190104
Lead Channel Impedance Value: 247 Ohm
Lead Channel Impedance Value: 361 Ohm
Lead Channel Impedance Value: 380 Ohm
Lead Channel Impedance Value: 418 Ohm
Lead Channel Pacing Threshold Amplitude: 0.75 V
Lead Channel Pacing Threshold Amplitude: 0.75 V
Lead Channel Pacing Threshold Pulse Width: 0.4 ms
Lead Channel Pacing Threshold Pulse Width: 0.4 ms
Lead Channel Sensing Intrinsic Amplitude: 2.125 mV
Lead Channel Sensing Intrinsic Amplitude: 2.125 mV
Lead Channel Setting Pacing Amplitude: 1.5 V
Lead Channel Setting Pacing Amplitude: 2.5 V
Lead Channel Setting Pacing Pulse Width: 0.4 ms
Lead Channel Setting Sensing Sensitivity: 4 mV

## 2022-06-15 NOTE — Progress Notes (Signed)
Remote pacemaker transmission.   

## 2022-07-09 ENCOUNTER — Other Ambulatory Visit: Payer: Self-pay | Admitting: Internal Medicine

## 2022-07-13 ENCOUNTER — Ambulatory Visit
Admission: EM | Admit: 2022-07-13 | Discharge: 2022-07-13 | Disposition: A | Discharge status: Home / Self Care | Payer: Medicare Other | Attending: Family Medicine | Admitting: Family Medicine

## 2022-07-13 ENCOUNTER — Ambulatory Visit (INDEPENDENT_AMBULATORY_CARE_PROVIDER_SITE_OTHER): Payer: Medicare Other

## 2022-07-13 DIAGNOSIS — I10 Essential (primary) hypertension: Secondary | ICD-10-CM | POA: Diagnosis not present

## 2022-07-13 DIAGNOSIS — W19XXXA Unspecified fall, initial encounter: Secondary | ICD-10-CM

## 2022-07-13 DIAGNOSIS — M25551 Pain in right hip: Secondary | ICD-10-CM

## 2022-07-13 MED ORDER — MELOXICAM 15 MG PO TABS: 15.0000 mg | ORAL_TABLET | Freq: Every day | ORAL | 0 refills | Status: DC

## 2022-07-13 NOTE — ED Triage Notes (Signed)
Pt presents with right hip pain after a fall X 7 days ago.

## 2022-07-13 NOTE — Discharge Instructions (Signed)
Your blood pressure was noted to be elevated during your visit today. If you are currently taking medication for high blood pressure, please ensure you are taking this as directed. If you do not have a history of high blood pressure and your blood pressure remains persistently elevated, you may need to begin taking a medication at some point. You may return here within the next few days to recheck if unable to see your primary care provider or if you do not have a one.  BP (!) 169/82 (BP Location: Left Arm)   Pulse 71   Temp 98.2 F (36.8 C) (Oral)   Resp 17   SpO2 96%   BP Readings from Last 3 Encounters:  07/13/22 (!) 169/82  03/11/22 128/82  11/28/21 (!) 168/76

## 2022-07-13 NOTE — ED Provider Notes (Addendum)
[place]   [id number] 07/13/22 Arrival Time: 7619  ASSESSMENT & PLAN:  1. Right hip pain   2. Elevated blood pressure reading in office with diagnosis of hypertension    I have personally viewed the imaging studies ordered this visit. No bony abnormality or fracture appreciated.  Trial of: New Prescriptions   MELOXICAM (MOBIC) 15 MG TABLET    Take 1 tablet (15 mg total) by mouth daily.   Having no trouble with unassisted ambulation.  Orders Placed This Encounter  Procedures   DG Hip Unilat With Pelvis 2-3 Views Right    Recommend:  Follow-up Information     Tisovec, Fransico Him, MD.   Specialty: Internal Medicine Why: If worsening or failing to improve as anticipated. Contact information: [place] [address] [phone]                  Discharge Instructions      Your blood pressure was noted to be elevated during your visit today. If you are currently taking medication for high blood pressure, please ensure you are taking this as directed. If you do not have a history of high blood pressure and your blood pressure remains persistently elevated, you may need to begin taking a medication at some point. You may return here within the next few days to recheck if unable to see your primary care provider or if you do not have a one.  BP (!) 169/82 (BP Location: Left Arm)   Pulse 71   Temp 98.2 F (36.8 C) (Oral)   Resp 17   SpO2 96%   BP Readings from Last 3 Encounters:  07/13/22 (!) 169/82  03/11/22 128/82  11/28/21 (!) 168/76        Reviewed expectations re: course of current medical issues. Questions answered. Outlined signs and symptoms indicating need for more acute intervention. Patient verbalized understanding. After Visit Summary given.  SUBJECTIVE: History from: patient. [name] is a 87 y.o. female who reports mild to moderate pain around R hip; s/p fall from standing 7 d ago; took a few d for  the pain to manifest. Pain is not worsening; "just bothers me". No extremity sensation changes or weakness. Normal bowel/bladder habits. Tylenol without much relief.  Past Surgical History:  Procedure Laterality Date   BRAIN BIOPSY  1998   Benign   CARDIAC CATHETERIZATION  09/12/05   Dr. Jenkins Rouge    COLONOSCOPY  2013   [facility] Plan -polyps   PACEMAKER INSERTION  09/13/05   Medtronic, dual chamber. Dr. Lovena Le    [facility] GENERATOR CHANGEOUT N/A 11/24/2017   Procedure: PPM GENERATOR CHANGEOUT;  Surgeon: Evans Lance, MD;  Location: [place] [facility];  Service: Cardiovascular;  Laterality: N/A;   TOTAL ABDOMINAL HYSTERECTOMY W/ BILATERAL SALPINGOOPHORECTOMY     WISDOM TOOTH EXTRACTION      Increased blood pressure noted today. Reports that she is treated for HTN. She reports taking medications as instructed, no chest pain on exertion, no dyspnea on exertion, no swelling of ankles, no orthostatic dizziness or lightheadedness, no orthopnea or paroxysmal nocturnal dyspnea, and no palpitations.   OBJECTIVE:  Vitals:   07/13/22 1259  BP: (!) 169/82  Pulse: 71  Resp: 17  Temp: 98.2 F (36.8 C)  TempSrc: Oral  SpO2: 96%    General appearance: alert; no distress HEENT: ; AT Neck: supple with FROM Resp: unlabored respirations Extremities: RLE: warm with well perfused appearance; poorly localized mild tenderness around R hip area;  without gross deformities; swelling: none; bruising: none; hip ROM: normal, with mild discomfort reported CV: brisk extremity capillary refill of RLE; 1+ DP pulse of RLE. Skin: warm and dry; no visible rashes Neurologic: gait normal; normal sensation and strength of RLE Psychological: alert and cooperative; normal mood and affect  Imaging: DG Hip Unilat With Pelvis 2-3 Views Right  Result Date: 07/13/2022 CLINICAL DATA:  Right hip pain after falling 7 days ago. EXAM: DG HIP (WITH OR WITHOUT PELVIS) 2-3V RIGHT COMPARISON:  None FINDINGS: No evidence of pelvic  or hip fracture. Chronic lumbar curvature and degenerative change. IMPRESSION: No acute finding. No hip osteoarthritis is visible. Curvature and degenerative change of the lumbar spine. Electronically Signed   By: Nelson Chimes M.D.   On: 07/13/2022 13:15      Allergies  Allergen Reactions   Sulfa Drugs Cross Reactors     Swelling, blisters and itching    Past Medical History:  Diagnosis Date   Atrioventricular block, complete ([facility])    Diverticulosis    DM (diabetes mellitus) ([place])    Hemorrhoids    HTN (hypertension)    Hyperlipidemia    Osteoporosis    Pacemaker    Tubular adenoma of colon 03/2010   Social History   Socioeconomic History   Marital status: Married    Spouse name: Not on file   Number of children: 1   Years of education: Not on file   Highest education level: Not on file  Occupational History   Occupation: Retired from [employer] store    Employer: RETIRED  Tobacco Use   Smoking status: Never   Smokeless tobacco: Never   Tobacco comments:    tobacco use - no  Vaping Use   Vaping Use: Never used  Substance and Sexual Activity   Alcohol use: No   Drug use: No   Sexual activity: Not on file    Comment: Hysterectomy  Other Topics Concern   Not on file  Social History Narrative   Lives in [place] with her husband.    Social Determinants of Health   Financial Resource Strain: Not on file  Food Insecurity: Not on file  Transportation Needs: Not on file  Physical Activity: Not on file  Stress: Not on file  Social Connections: Not on file   Family History  Problem Relation Age of Onset   Diabetes Father    Lung cancer Father    Colon cancer Neg Hx    Colon polyps Neg Hx    Esophageal cancer Neg Hx    Rectal cancer Neg Hx    Stomach cancer Neg Hx    Breast cancer Neg Hx    Past Surgical History:  Procedure Laterality Date   BRAIN BIOPSY  1998   Benign   CARDIAC CATHETERIZATION  09/12/05   Dr. Jenkins Rouge    COLONOSCOPY  2013   [facility] -polyps    PACEMAKER INSERTION  09/13/05   Medtronic, dual chamber. Dr. Lovena Le    [facility] GENERATOR CHANGEOUT N/A 11/24/2017   Procedure: PPM GENERATOR CHANGEOUT;  Surgeon: Evans Lance, MD;  Location: [place] [facility];  Service: Cardiovascular;  Laterality: N/A;   TOTAL ABDOMINAL HYSTERECTOMY W/ BILATERAL SALPINGOOPHORECTOMY     WISDOM TOOTH EXTRACTION         Vanessa Kick, MD 07/16/22 1126    Vanessa Kick, MD 07/16/22 1126    Vanessa Kick, MD 07/16/22 [phone]

## 2022-08-23 ENCOUNTER — Ambulatory Visit (INDEPENDENT_AMBULATORY_CARE_PROVIDER_SITE_OTHER): Payer: Medicare Other

## 2022-08-23 DIAGNOSIS — I442 Atrioventricular block, complete: Secondary | ICD-10-CM | POA: Diagnosis not present

## 2022-08-23 LAB — CUP PACEART REMOTE DEVICE CHECK
Battery Remaining Longevity: 72 mo
Battery Voltage: 2.97 V
Brady Statistic AP VP Percent: 2.58 %
Brady Statistic AP VS Percent: 0 %
Brady Statistic AS VP Percent: 97.41 %
Brady Statistic AS VS Percent: 0 %
Brady Statistic RA Percent Paced: 2.58 %
Brady Statistic RV Percent Paced: 100 %
Date Time Interrogation Session: 20231003042003
Implantable Lead Implant Date: 20061024
Implantable Lead Implant Date: 20061024
Implantable Lead Location: 753859
Implantable Lead Location: 753860
Implantable Lead Model: 5076
Implantable Lead Model: 5076
Implantable Pulse Generator Implant Date: 20190104
Lead Channel Impedance Value: 266 Ohm
Lead Channel Impedance Value: 361 Ohm
Lead Channel Impedance Value: 399 Ohm
Lead Channel Impedance Value: 456 Ohm
Lead Channel Pacing Threshold Amplitude: 0.875 V
Lead Channel Pacing Threshold Amplitude: 0.875 V
Lead Channel Pacing Threshold Pulse Width: 0.4 ms
Lead Channel Pacing Threshold Pulse Width: 0.4 ms
Lead Channel Sensing Intrinsic Amplitude: 2.125 mV
Lead Channel Sensing Intrinsic Amplitude: 2.125 mV
Lead Channel Setting Pacing Amplitude: 1.75 V
Lead Channel Setting Pacing Amplitude: 2.5 V
Lead Channel Setting Pacing Pulse Width: 0.4 ms
Lead Channel Setting Sensing Sensitivity: 4 mV

## 2022-09-02 NOTE — Progress Notes (Signed)
Remote pacemaker transmission.   

## 2022-11-22 ENCOUNTER — Ambulatory Visit (INDEPENDENT_AMBULATORY_CARE_PROVIDER_SITE_OTHER): Payer: Medicare Other

## 2022-11-22 DIAGNOSIS — I442 Atrioventricular block, complete: Secondary | ICD-10-CM | POA: Diagnosis not present

## 2022-11-22 LAB — CUP PACEART REMOTE DEVICE CHECK
Battery Remaining Longevity: 66 mo
Battery Voltage: 2.97 V
Brady Statistic AP VP Percent: 3.81 %
Brady Statistic AP VS Percent: 0 %
Brady Statistic AS VP Percent: 96.18 %
Brady Statistic AS VS Percent: 0 %
Brady Statistic RA Percent Paced: 3.8 %
Brady Statistic RV Percent Paced: 100 %
Date Time Interrogation Session: 20240102031416
Implantable Lead Connection Status: 753985
Implantable Lead Connection Status: 753985
Implantable Lead Implant Date: 20061024
Implantable Lead Implant Date: 20061024
Implantable Lead Location: 753859
Implantable Lead Location: 753860
Implantable Lead Model: 5076
Implantable Lead Model: 5076
Implantable Pulse Generator Implant Date: 20190104
Lead Channel Impedance Value: 266 Ohm
Lead Channel Impedance Value: 361 Ohm
Lead Channel Impedance Value: 361 Ohm
Lead Channel Impedance Value: 418 Ohm
Lead Channel Pacing Threshold Amplitude: 0.75 V
Lead Channel Pacing Threshold Amplitude: 0.875 V
Lead Channel Pacing Threshold Pulse Width: 0.4 ms
Lead Channel Pacing Threshold Pulse Width: 0.4 ms
Lead Channel Sensing Intrinsic Amplitude: 2.125 mV
Lead Channel Sensing Intrinsic Amplitude: 2.125 mV
Lead Channel Setting Pacing Amplitude: 1.75 V
Lead Channel Setting Pacing Amplitude: 2.5 V
Lead Channel Setting Pacing Pulse Width: 0.4 ms
Lead Channel Setting Sensing Sensitivity: 4 mV
Zone Setting Status: 755011
Zone Setting Status: 755011

## 2022-12-19 NOTE — Progress Notes (Signed)
Remote pacemaker transmission.   

## 2022-12-26 ENCOUNTER — Other Ambulatory Visit: Payer: Self-pay | Admitting: Internal Medicine

## 2022-12-26 DIAGNOSIS — Z1231 Encounter for screening mammogram for malignant neoplasm of breast: Secondary | ICD-10-CM

## 2023-02-10 ENCOUNTER — Ambulatory Visit
Admission: RE | Admit: 2023-02-10 | Discharge: 2023-02-10 | Disposition: A | Discharge status: Home / Self Care | Payer: Medicare Other | Source: Ambulatory Visit | Attending: Internal Medicine | Admitting: Internal Medicine

## 2023-02-10 DIAGNOSIS — Z1231 Encounter for screening mammogram for malignant neoplasm of breast: Secondary | ICD-10-CM

## 2023-02-20 ENCOUNTER — Other Ambulatory Visit: Payer: Self-pay | Admitting: Internal Medicine

## 2023-02-21 ENCOUNTER — Ambulatory Visit (INDEPENDENT_AMBULATORY_CARE_PROVIDER_SITE_OTHER): Payer: Medicare Other

## 2023-02-21 DIAGNOSIS — I442 Atrioventricular block, complete: Secondary | ICD-10-CM | POA: Diagnosis not present

## 2023-02-21 LAB — CUP PACEART REMOTE DEVICE CHECK
Battery Remaining Longevity: 59 mo
Battery Voltage: 2.96 V
Brady Statistic AP VP Percent: 1.41 %
Brady Statistic AP VS Percent: 0 %
Brady Statistic AS VP Percent: 98.59 %
Brady Statistic AS VS Percent: 0.01 %
Brady Statistic RA Percent Paced: 1.4 %
Brady Statistic RV Percent Paced: 99.99 %
Date Time Interrogation Session: 20240402040657
Implantable Lead Connection Status: 753985
Implantable Lead Connection Status: 753985
Implantable Lead Implant Date: 20061024
Implantable Lead Implant Date: 20061024
Implantable Lead Location: 753859
Implantable Lead Location: 753860
Implantable Lead Model: 5076
Implantable Lead Model: 5076
Implantable Pulse Generator Implant Date: 20190104
Lead Channel Impedance Value: 247 Ohm
Lead Channel Impedance Value: 361 Ohm
Lead Channel Impedance Value: 361 Ohm
Lead Channel Impedance Value: 418 Ohm
Lead Channel Pacing Threshold Amplitude: 0.75 V
Lead Channel Pacing Threshold Amplitude: 0.875 V
Lead Channel Pacing Threshold Pulse Width: 0.4 ms
Lead Channel Pacing Threshold Pulse Width: 0.4 ms
Lead Channel Sensing Intrinsic Amplitude: 1.875 mV
Lead Channel Sensing Intrinsic Amplitude: 1.875 mV
Lead Channel Setting Pacing Amplitude: 1.5 V
Lead Channel Setting Pacing Amplitude: 2.5 V
Lead Channel Setting Pacing Pulse Width: 0.4 ms
Lead Channel Setting Sensing Sensitivity: 4 mV
Zone Setting Status: 755011
Zone Setting Status: 755011

## 2023-04-04 NOTE — Progress Notes (Signed)
Remote pacemaker transmission.   

## 2023-05-23 ENCOUNTER — Ambulatory Visit: Payer: Medicare Other

## 2023-05-23 DIAGNOSIS — I442 Atrioventricular block, complete: Secondary | ICD-10-CM | POA: Diagnosis not present

## 2023-05-23 LAB — CUP PACEART REMOTE DEVICE CHECK
Battery Remaining Longevity: 54 mo
Battery Voltage: 2.96 V
Brady Statistic AP VP Percent: 3.94 %
Brady Statistic AP VS Percent: 0 %
Brady Statistic AS VP Percent: 96.06 %
Brady Statistic AS VS Percent: 0.01 %
Brady Statistic RA Percent Paced: 3.93 %
Brady Statistic RV Percent Paced: 99.99 %
Date Time Interrogation Session: 20240701234200
Implantable Lead Connection Status: 753985
Implantable Lead Connection Status: 753985
Implantable Lead Implant Date: 20061024
Implantable Lead Implant Date: 20061024
Implantable Lead Location: 753859
Implantable Lead Location: 753860
Implantable Lead Model: 5076
Implantable Lead Model: 5076
Implantable Pulse Generator Implant Date: 20190104
Lead Channel Impedance Value: 247 Ohm
Lead Channel Impedance Value: 342 Ohm
Lead Channel Impedance Value: 361 Ohm
Lead Channel Impedance Value: 380 Ohm
Lead Channel Pacing Threshold Amplitude: 0.875 V
Lead Channel Pacing Threshold Amplitude: 0.875 V
Lead Channel Pacing Threshold Pulse Width: 0.4 ms
Lead Channel Pacing Threshold Pulse Width: 0.4 ms
Lead Channel Sensing Intrinsic Amplitude: 1.75 mV
Lead Channel Sensing Intrinsic Amplitude: 1.75 mV
Lead Channel Setting Pacing Amplitude: 1.75 V
Lead Channel Setting Pacing Amplitude: 2.5 V
Lead Channel Setting Pacing Pulse Width: 0.4 ms
Lead Channel Setting Sensing Sensitivity: 4 mV
Zone Setting Status: 755011
Zone Setting Status: 755011

## 2023-06-13 ENCOUNTER — Encounter: Payer: Self-pay | Admitting: Internal Medicine

## 2023-06-13 ENCOUNTER — Ambulatory Visit: Payer: Medicare Other | Attending: Internal Medicine | Admitting: Internal Medicine

## 2023-06-13 VITALS — BP 148/74 | HR 73 | Ht 59.0 in | Wt 110.6 lb

## 2023-06-13 DIAGNOSIS — I5032 Chronic diastolic (congestive) heart failure: Secondary | ICD-10-CM | POA: Diagnosis not present

## 2023-06-13 DIAGNOSIS — Z950 Presence of cardiac pacemaker: Secondary | ICD-10-CM | POA: Diagnosis not present

## 2023-06-13 DIAGNOSIS — I10 Essential (primary) hypertension: Secondary | ICD-10-CM

## 2023-06-13 NOTE — Patient Instructions (Addendum)
Medication Instructions:  Your physician recommends that you continue on your current medications as directed. Please refer to the Current Medication list given to you today.  *If you need a refill on your cardiac medications before your next appointment, please call your pharmacy*  Lab Work: None ordered.  If you have labs (blood work) drawn today and your tests are completely normal, you will receive your results only by: MyChart Message (if you have MyChart) OR A paper copy in the mail If you have any lab test that is abnormal or we need to change your treatment, we will call you to review the results.  Testing/Procedures: None ordered.  Follow-Up: At [facility], you and your health needs are our priority.  As part of our continuing mission to provide you with exceptional heart care, we have created designated Provider Care Teams.  These Care Teams include your primary Cardiologist (physician) and Advanced Practice Providers (APPs -  Physician Assistants and Nurse Practitioners) who all work together to provide you with the care you need, when you need it.  We recommend signing up for the patient portal called "MyChart".  Sign up information is provided on this After Visit Summary.  MyChart is used to connect with patients for Virtual Visits (Telemedicine).  Patients are able to view lab/test results, encounter notes, upcoming appointments, etc.  Non-urgent messages can be sent to your provider as well.   To learn more about what you can do with MyChart, go to [email or web address].    Your next appointment:   1 year(s)  The format for your next appointment:   In Person  Provider:   Lewayne Bunting, MD{or one of the following Advanced Practice Providers on your designated Care Team:   Francis Dowse, [place] Casimiro Needle "Mardelle Matte" [place], [place] Earnest Rosier, NP  Remote monitoring is used to monitor your Pacemaker/ ICD from home. This monitoring reduces the number of office visits required  to check your device to one time per year. It allows [place] to keep an eye on the functioning of your device to ensure it is working properly. You are scheduled for a device check from home on 08/22/2023. You may send your transmission at any time that day. If you have a wireless device, the transmission will be sent automatically. After your physician reviews your transmission, you will receive a postcard with your next transmission date.  Important Information About Sugar

## 2023-06-13 NOTE — Progress Notes (Signed)
HPI [name] returns today for followup. She is a pleasant 88 yo woman with CHB, s/p PPM insertion. She has HTN. She has done well in the interim with no chest pain or sob. No edema. No syncope.  Allergies  Allergen Reactions   Sulfa Drugs Cross Reactors     Swelling, blisters and itching     Current Outpatient Medications  Medication Sig Dispense Refill   acetaminophen (TYLENOL) 325 MG tablet Take 325 mg by mouth every 6 (six) hours as needed (for pain/headaches.).     allopurinol (ZYLOPRIM) 100 MG tablet Take 100 mg by mouth daily.     aspirin EC 81 MG tablet Take 81 mg by mouth daily.     Calcium Carb-Cholecalciferol 500-200 MG-UNIT TABS Take 1 tablet by mouth every other day.     Cholecalciferol (VITAMIN D3) 2000 units TABS Take 2,000 Units by mouth daily.     furosemide (LASIX) 40 MG tablet TAKE 1/2 TABLET BY MOUTH EVERY DAY 90 tablet 1   glipiZIDE (GLUCOTROL XL) 10 MG 24 hr tablet Take 10 mg by mouth daily with breakfast.     lisinopril (ZESTRIL) 20 MG tablet TAKE 1 TABLET BY MOUTH EVERY DAY 90 tablet 3   meclizine (ANTIVERT) 25 MG tablet Take 25 mg by mouth 2 (two) times daily as needed for dizziness or nausea.     metoprolol (LOPRESSOR) 50 MG tablet Take 50 mg by mouth 2 (two) times daily.     Multiple Vitamin (MULTIVITAMIN WITH MINERALS) TABS tablet Take 1 tablet by mouth daily.     simvastatin (ZOCOR) 40 MG tablet Take 40 mg by mouth daily.     sitaGLIPtan-metformin (JANUMET) 50-1000 MG per tablet Take 1 tablet by mouth 2 (two) times daily.     Current Facility-Administered Medications  Medication Dose Route Frequency Provider Last Rate Last Admin   0.9 %  sodium chloride infusion  500 mL Intravenous Continuous Meryl Dare, MD         Past Medical History:  Diagnosis Date   Atrioventricular block, complete (HCC)    Diverticulosis    DM (diabetes mellitus) (HCC)    Hemorrhoids    HTN (hypertension)    Hyperlipidemia    Osteoporosis    Pacemaker     Tubular adenoma of colon 03/2010    ROS:   All systems reviewed and negative except as noted in the HPI.   Past Surgical History:  Procedure Laterality Date   BRAIN BIOPSY  1998   Benign   CARDIAC CATHETERIZATION  09/12/05   Dr. Charlton Haws    COLONOSCOPY  2013   Russella Dar -polyps   PACEMAKER INSERTION  09/13/05   Medtronic, dual chamber. Dr. Ladona Ridgel    [facility] GENERATOR CHANGEOUT N/A 11/24/2017   Procedure: PPM GENERATOR CHANGEOUT;  Surgeon: Marinus Maw, MD;  Location: [facility] INVASIVE CV LAB;  Service: Cardiovascular;  Laterality: N/A;   TOTAL ABDOMINAL HYSTERECTOMY W/ BILATERAL SALPINGOOPHORECTOMY     WISDOM TOOTH EXTRACTION       Family History  Problem Relation Age of Onset   Diabetes Father    Lung cancer Father    Colon cancer Neg Hx    Colon polyps Neg Hx    Esophageal cancer Neg Hx    Rectal cancer Neg Hx    Stomach cancer Neg Hx    Breast cancer Neg Hx      Social History   Socioeconomic History   Marital status: Married  Spouse name: Not on file   Number of children: 1   Years of education: Not on file   Highest education level: Not on file  Occupational History   Occupation: Retired from [employer] store    Employer: RETIRED  Tobacco Use   Smoking status: Never   Smokeless tobacco: Never   Tobacco comments:    tobacco use - no  Vaping Use   Vaping status: Never Used  Substance and Sexual Activity   Alcohol use: No   Drug use: No   Sexual activity: Not on file    Comment: Hysterectomy  Other Topics Concern   Not on file  Social History Narrative   Lives in [place] with her husband.    Social Determinants of Health   Financial Resource Strain: Not on file  Food Insecurity: Not on file  Transportation Needs: Not on file  Physical Activity: Not on file  Stress: Not on file  Social Connections: Not on file  Intimate Partner Violence: Not on file     BP (!) 148/74   Pulse 73   Ht 4\' 11"  (1.499 m)   Wt 110 lb 9.6 oz (50.2 kg)   SpO2 97%   BMI 22.34  kg/m   Physical Exam:  Well appearing NAD HEENT: Unremarkable Neck:  No JVD, no thyromegally Lymphatics:  No adenopathy Back:  No CVA tenderness Lungs:  Clear with no wheezes HEART:  Regular rate rhythm, no murmurs, no rubs, no clicks Abd:  soft, positive bowel sounds, no organomegally, no rebound, no guarding Ext:  2 plus pulses, no edema, no cyanosis, no clubbing Skin:  No rashes no nodules Neuro:  CN II through XII intact, motor grossly intact   ECG - NSR with ventricular pacing   DEVICE  Normal device function.  See PaceArt for details.   Assess/Plan: 1. CHB- she is asymptomatic, s/p PPM insertion.  2. PPM - her medtronic DDD PM is working.  No change in her programming today. 4.5 years of battery longevity. 3. HTN - her bp is well controlled. We will follow and continue her current meds. 4. Chronic diastolic heart failure - her symptoms are class 2. She will continue a low sodium diet and she will continue her current meds.  Barbara Chroman Sherri Mcarthy,MD

## 2023-06-16 NOTE — Progress Notes (Signed)
Remote pacemaker transmission.   

## 2023-06-27 ENCOUNTER — Other Ambulatory Visit: Payer: Self-pay | Admitting: Internal Medicine

## 2023-08-12 ENCOUNTER — Other Ambulatory Visit: Payer: Self-pay | Admitting: Internal Medicine

## 2023-08-21 LAB — CUP PACEART REMOTE DEVICE CHECK
Battery Remaining Longevity: 49 mo
Battery Voltage: 2.95 V
Brady Statistic AP VP Percent: 3.46 %
Brady Statistic AP VS Percent: 0 %
Brady Statistic AS VP Percent: 96.54 %
Brady Statistic AS VS Percent: 0 %
Brady Statistic RA Percent Paced: 3.45 %
Brady Statistic RV Percent Paced: 100 %
Date Time Interrogation Session: 20240930040044
Implantable Lead Connection Status: 753985
Implantable Lead Connection Status: 753985
Implantable Lead Implant Date: 20061024
Implantable Lead Implant Date: 20061024
Implantable Lead Location: 753859
Implantable Lead Location: 753860
Implantable Lead Model: 5076
Implantable Lead Model: 5076
Implantable Pulse Generator Implant Date: 20190104
Lead Channel Impedance Value: 247 Ohm
Lead Channel Impedance Value: 342 Ohm
Lead Channel Impedance Value: 380 Ohm
Lead Channel Impedance Value: 418 Ohm
Lead Channel Pacing Threshold Amplitude: 0.75 V
Lead Channel Pacing Threshold Amplitude: 0.875 V
Lead Channel Pacing Threshold Pulse Width: 0.4 ms
Lead Channel Pacing Threshold Pulse Width: 0.4 ms
Lead Channel Sensing Intrinsic Amplitude: 1.75 mV
Lead Channel Sensing Intrinsic Amplitude: 1.75 mV
Lead Channel Setting Pacing Amplitude: 2 V
Lead Channel Setting Pacing Amplitude: 2.5 V
Lead Channel Setting Pacing Pulse Width: 0.4 ms
Lead Channel Setting Sensing Sensitivity: 4 mV
Zone Setting Status: 755011
Zone Setting Status: 755011

## 2023-08-22 ENCOUNTER — Ambulatory Visit (INDEPENDENT_AMBULATORY_CARE_PROVIDER_SITE_OTHER): Payer: Medicare Other

## 2023-08-22 DIAGNOSIS — I442 Atrioventricular block, complete: Secondary | ICD-10-CM | POA: Diagnosis not present

## 2023-09-05 NOTE — Progress Notes (Signed)
Remote pacemaker transmission.   

## 2023-11-21 ENCOUNTER — Ambulatory Visit (INDEPENDENT_AMBULATORY_CARE_PROVIDER_SITE_OTHER): Payer: Medicare Other

## 2023-11-21 DIAGNOSIS — I442 Atrioventricular block, complete: Secondary | ICD-10-CM

## 2023-11-21 LAB — CUP PACEART REMOTE DEVICE CHECK
Battery Remaining Longevity: 45 mo
Battery Voltage: 2.95 V
Brady Statistic AP VP Percent: 4.77 %
Brady Statistic AP VS Percent: 0 %
Brady Statistic AS VP Percent: 95.23 %
Brady Statistic AS VS Percent: 0 %
Brady Statistic RA Percent Paced: 4.76 %
Brady Statistic RV Percent Paced: 100 %
Date Time Interrogation Session: 20241231052417
Implantable Lead Connection Status: 753985
Implantable Lead Connection Status: 753985
Implantable Lead Implant Date: 20061024
Implantable Lead Implant Date: 20061024
Implantable Lead Location: 753859
Implantable Lead Location: 753860
Implantable Lead Model: 5076
Implantable Lead Model: 5076
Implantable Pulse Generator Implant Date: 20190104
Lead Channel Impedance Value: 247 Ohm
Lead Channel Impedance Value: 342 Ohm
Lead Channel Impedance Value: 361 Ohm
Lead Channel Impedance Value: 418 Ohm
Lead Channel Pacing Threshold Amplitude: 0.75 V
Lead Channel Pacing Threshold Amplitude: 0.875 V
Lead Channel Pacing Threshold Pulse Width: 0.4 ms
Lead Channel Pacing Threshold Pulse Width: 0.4 ms
Lead Channel Sensing Intrinsic Amplitude: 1.875 mV
Lead Channel Sensing Intrinsic Amplitude: 1.875 mV
Lead Channel Setting Pacing Amplitude: 2 V
Lead Channel Setting Pacing Amplitude: 2.5 V
Lead Channel Setting Pacing Pulse Width: 0.4 ms
Lead Channel Setting Sensing Sensitivity: 4 mV
Zone Setting Status: 755011
Zone Setting Status: 755011

## 2024-01-02 NOTE — Progress Notes (Signed)
Remote pacemaker transmission.

## 2024-01-05 ENCOUNTER — Other Ambulatory Visit: Payer: Self-pay | Admitting: Internal Medicine

## 2024-01-05 DIAGNOSIS — Z1231 Encounter for screening mammogram for malignant neoplasm of breast: Secondary | ICD-10-CM

## 2024-02-12 ENCOUNTER — Ambulatory Visit
Admission: RE | Admit: 2024-02-12 | Discharge: 2024-02-12 | Disposition: A | Discharge status: Home / Self Care | Payer: Medicare Other | Source: Ambulatory Visit | Attending: Internal Medicine | Admitting: Internal Medicine

## 2024-02-12 DIAGNOSIS — Z1231 Encounter for screening mammogram for malignant neoplasm of breast: Secondary | ICD-10-CM

## 2024-02-20 ENCOUNTER — Ambulatory Visit (INDEPENDENT_AMBULATORY_CARE_PROVIDER_SITE_OTHER): Payer: Medicare Other

## 2024-02-20 DIAGNOSIS — I442 Atrioventricular block, complete: Secondary | ICD-10-CM | POA: Diagnosis not present

## 2024-02-21 LAB — CUP PACEART REMOTE DEVICE CHECK
Battery Remaining Longevity: 42 mo
Battery Voltage: 2.94 V
Brady Statistic AP VP Percent: 3.44 %
Brady Statistic AP VS Percent: 0 %
Brady Statistic AS VP Percent: 96.56 %
Brady Statistic AS VS Percent: 0 %
Brady Statistic RA Percent Paced: 3.43 %
Brady Statistic RV Percent Paced: 100 %
Date Time Interrogation Session: 20250401003541
Implantable Lead Connection Status: 753985
Implantable Lead Connection Status: 753985
Implantable Lead Implant Date: 20061024
Implantable Lead Implant Date: 20061024
Implantable Lead Location: 753859
Implantable Lead Location: 753860
Implantable Lead Model: 5076
Implantable Lead Model: 5076
Implantable Pulse Generator Implant Date: 20190104
Lead Channel Impedance Value: 247 Ohm
Lead Channel Impedance Value: 361 Ohm
Lead Channel Impedance Value: 361 Ohm
Lead Channel Impedance Value: 399 Ohm
Lead Channel Pacing Threshold Amplitude: 0.75 V
Lead Channel Pacing Threshold Amplitude: 0.875 V
Lead Channel Pacing Threshold Pulse Width: 0.4 ms
Lead Channel Pacing Threshold Pulse Width: 0.4 ms
Lead Channel Sensing Intrinsic Amplitude: 1.625 mV
Lead Channel Sensing Intrinsic Amplitude: 1.625 mV
Lead Channel Setting Pacing Amplitude: 2 V
Lead Channel Setting Pacing Amplitude: 2.5 V
Lead Channel Setting Pacing Pulse Width: 0.4 ms
Lead Channel Setting Sensing Sensitivity: 4 mV
Zone Setting Status: 755011
Zone Setting Status: 755011

## 2024-04-03 NOTE — Addendum Note (Signed)
 Addended by: Lott Rouleau A on: 04/03/2024 02:24 PM   Modules accepted: Orders

## 2024-04-03 NOTE — Progress Notes (Signed)
 Remote pacemaker transmission.

## 2024-05-21 ENCOUNTER — Ambulatory Visit: Payer: Medicare Other

## 2024-05-21 DIAGNOSIS — I442 Atrioventricular block, complete: Secondary | ICD-10-CM | POA: Diagnosis not present

## 2024-05-21 LAB — CUP PACEART REMOTE DEVICE CHECK
Battery Remaining Longevity: 39 mo
Battery Voltage: 2.94 V
Brady Statistic AP VP Percent: 5.12 %
Brady Statistic AP VS Percent: 0 %
Brady Statistic AS VP Percent: 94.87 %
Brady Statistic AS VS Percent: 0 %
Brady Statistic RA Percent Paced: 5.11 %
Brady Statistic RV Percent Paced: 100 %
Date Time Interrogation Session: 20250701031407
Implantable Lead Connection Status: 753985
Implantable Lead Connection Status: 753985
Implantable Lead Implant Date: 20061024
Implantable Lead Implant Date: 20061024
Implantable Lead Location: 753859
Implantable Lead Location: 753860
Implantable Lead Model: 5076
Implantable Lead Model: 5076
Implantable Pulse Generator Implant Date: 20190104
Lead Channel Impedance Value: 228 Ohm
Lead Channel Impedance Value: 342 Ohm
Lead Channel Impedance Value: 361 Ohm
Lead Channel Impedance Value: 418 Ohm
Lead Channel Pacing Threshold Amplitude: 0.75 V
Lead Channel Pacing Threshold Amplitude: 0.875 V
Lead Channel Pacing Threshold Pulse Width: 0.4 ms
Lead Channel Pacing Threshold Pulse Width: 0.4 ms
Lead Channel Sensing Intrinsic Amplitude: 1.625 mV
Lead Channel Sensing Intrinsic Amplitude: 1.625 mV
Lead Channel Setting Pacing Amplitude: 2 V
Lead Channel Setting Pacing Amplitude: 2.5 V
Lead Channel Setting Pacing Pulse Width: 0.4 ms
Lead Channel Setting Sensing Sensitivity: 4 mV
Zone Setting Status: 755011
Zone Setting Status: 755011

## 2024-05-22 ENCOUNTER — Ambulatory Visit: Payer: Self-pay | Admitting: Internal Medicine

## 2024-06-08 NOTE — Progress Notes (Unsigned)
  Electrophysiology Office Note:   Date:  06/13/2024  ID:  Barbara, Valentine 04-21-1935, MRN [id number]  Primary Cardiologist: None Primary Heart Failure: None Electrophysiologist: Danelle Birmingham, MD       History of Present Illness:   [name] is a 89 y.o. female with h/o CHB s/p PPM, HTN, HLD, DM II seen today for routine electrophysiology followup.   Since last being seen in our clinic the patient reports she has been doing well. She states my sister seems to have gotten all the bad things.  No device related concerns.   She denies chest pain, palpitations, dyspnea, PND, orthopnea, nausea, vomiting, dizziness, syncope, edema, weight gain, or early satiety.   Review of systems complete and found to be negative unless listed in HPI.   EP Information / Studies Reviewed:    EKG is ordered today. Personal review as below.  EKG Interpretation Date/Time:  Thursday June 13 2024 09:40:22 EDT Ventricular Rate:  63 PR Interval:  164 QRS Duration:  146 QT Interval:  446 QTC Calculation: 456 R Axis:   135  Text Interpretation: Atrial-sensed ventricular-paced rhythm Confirmed by Aniceto Jarvis ([id number]) on 06/13/2024 9:50:06 AM   PPM Interrogation-  reviewed in detail today,  See PACEART report.  Device History: Medtronic Dual Chamber PPM implanted 09/13/05 for CHB  Risk Assessment/Calculations:              Physical Exam:   VS:  BP 100/70   Pulse 63   Ht 4' 11 (1.499 m)   Wt 108 lb (49 kg)   SpO2 99%   BMI 21.81 kg/m    Wt Readings from Last 3 Encounters:  06/13/24 108 lb (49 kg)  06/13/23 110 lb 9.6 oz (50.2 kg)  03/11/22 118 lb 3.2 oz (53.6 kg)     GEN: pleasant, elderly female well nourished, well developed in no acute distress NECK: No JVD; No carotid bruits CARDIAC: Regular rate and rhythm, no murmurs, rubs, gallops. PPM site wnl, no tethering.  RESPIRATORY:  Clear to auscultation without rales, wheezing or rhonchi  ABDOMEN: Soft, non-tender,  non-distended EXTREMITIES:  No edema; No deformity   ASSESSMENT AND PLAN:    CHB s/p Medtronic PPM  -Normal PPM function -See Pace Art report -No changes today  Chronic Diastolic Heart Failure -euvolemic on exam  -low Na+ diet   Hypertension  -well controlled on current regimen   Disposition:   Follow up with Dr. Birmingham in 12 months  Signed, Jarvis Aniceto, NP-C, AGACNP-BC [facility] - Electrophysiology  06/13/2024, 10:06 AM

## 2024-06-12 ENCOUNTER — Other Ambulatory Visit: Payer: Self-pay | Admitting: Internal Medicine

## 2024-06-13 ENCOUNTER — Encounter: Payer: Self-pay | Admitting: Pulmonary Disease

## 2024-06-13 ENCOUNTER — Ambulatory Visit: Attending: Pulmonary Disease | Admitting: Pulmonary Disease

## 2024-06-13 VITALS — BP 100/70 | HR 63 | Ht 59.0 in | Wt 108.0 lb

## 2024-06-13 DIAGNOSIS — Z950 Presence of cardiac pacemaker: Secondary | ICD-10-CM

## 2024-06-13 DIAGNOSIS — I442 Atrioventricular block, complete: Secondary | ICD-10-CM | POA: Diagnosis not present

## 2024-06-13 DIAGNOSIS — I10 Essential (primary) hypertension: Secondary | ICD-10-CM

## 2024-06-13 DIAGNOSIS — I5032 Chronic diastolic (congestive) heart failure: Secondary | ICD-10-CM | POA: Diagnosis not present

## 2024-06-13 LAB — CUP PACEART INCLINIC DEVICE CHECK
Date Time Interrogation Session: 20250724125540
Implantable Lead Connection Status: 753985
Implantable Lead Connection Status: 753985
Implantable Lead Implant Date: 20061024
Implantable Lead Implant Date: 20061024
Implantable Lead Location: 753859
Implantable Lead Location: 753860
Implantable Lead Model: 5076
Implantable Lead Model: 5076
Implantable Pulse Generator Implant Date: 20190104

## 2024-06-13 NOTE — Patient Instructions (Signed)
 Medication Instructions:  Your physician recommends that you continue on your current medications as directed. Please refer to the Current Medication list given to you today.  *If you need a refill on your cardiac medications before your next appointment, please call your pharmacy*  Lab Work: None ordered If you have labs (blood work) drawn today and your tests are completely normal, you will receive your results only by: MyChart Message (if you have MyChart) OR A paper copy in the mail If you have any lab test that is abnormal or we need to change your treatment, we will call you to review the results.  Follow-Up: At [facility], you and your health needs are our priority.  As part of our continuing mission to provide you with exceptional heart care, our providers are all part of one team.  This team includes your primary Cardiologist (physician) and Advanced Practice Providers or APPs (Physician Assistants and Nurse Practitioners) who all work together to provide you with the care you need, when you need it.  Your next appointment:   1 year(s)  Provider:   You will see one of the following Advanced Practice Providers on your designated Care Team:   Charlies Arthur, PA-C Michael Andy Tillery, PA-C Suzann Riddle, NP Daphne Barrack, NP  We recommend signing up for the patient portal called MyChart.  Sign up information is provided on this After Visit Summary.  MyChart is used to connect with patients for Virtual Visits (Telemedicine).  Patients are able to view lab/test results, encounter notes, upcoming appointments, etc.  Non-urgent messages can be sent to your provider as well.   To learn more about what you can do with MyChart, go to [email or web address].

## 2024-06-16 ENCOUNTER — Other Ambulatory Visit: Payer: Self-pay | Admitting: Internal Medicine

## 2024-07-05 ENCOUNTER — Other Ambulatory Visit: Payer: Self-pay | Admitting: Internal Medicine

## 2024-08-20 ENCOUNTER — Ambulatory Visit (INDEPENDENT_AMBULATORY_CARE_PROVIDER_SITE_OTHER): Payer: Medicare Other

## 2024-08-20 DIAGNOSIS — I5032 Chronic diastolic (congestive) heart failure: Secondary | ICD-10-CM | POA: Diagnosis not present

## 2024-08-21 ENCOUNTER — Ambulatory Visit: Payer: Self-pay | Admitting: Internal Medicine

## 2024-08-21 LAB — CUP PACEART REMOTE DEVICE CHECK
Battery Remaining Longevity: 36 mo
Battery Voltage: 2.93 V
Brady Statistic AP VP Percent: 4.65 %
Brady Statistic AP VS Percent: 0 %
Brady Statistic AS VP Percent: 95.35 %
Brady Statistic AS VS Percent: 0 %
Brady Statistic RA Percent Paced: 4.64 %
Brady Statistic RV Percent Paced: 100 %
Date Time Interrogation Session: 20250930021132
Implantable Lead Connection Status: 753985
Implantable Lead Connection Status: 753985
Implantable Lead Implant Date: 20061024
Implantable Lead Implant Date: 20061024
Implantable Lead Location: 753859
Implantable Lead Location: 753860
Implantable Lead Model: 5076
Implantable Lead Model: 5076
Implantable Pulse Generator Implant Date: 20190104
Lead Channel Impedance Value: 266 Ohm
Lead Channel Impedance Value: 361 Ohm
Lead Channel Impedance Value: 380 Ohm
Lead Channel Impedance Value: 399 Ohm
Lead Channel Pacing Threshold Amplitude: 0.75 V
Lead Channel Pacing Threshold Amplitude: 0.875 V
Lead Channel Pacing Threshold Pulse Width: 0.4 ms
Lead Channel Pacing Threshold Pulse Width: 0.4 ms
Lead Channel Sensing Intrinsic Amplitude: 1.875 mV
Lead Channel Sensing Intrinsic Amplitude: 1.875 mV
Lead Channel Setting Pacing Amplitude: 2 V
Lead Channel Setting Pacing Amplitude: 2.5 V
Lead Channel Setting Pacing Pulse Width: 0.4 ms
Lead Channel Setting Sensing Sensitivity: 4 mV
Zone Setting Status: 755011
Zone Setting Status: 755011

## 2024-08-21 NOTE — Progress Notes (Signed)
 Remote PPM Transmission

## 2024-08-22 NOTE — Progress Notes (Signed)
 Remote PPM Transmission

## 2024-11-19 ENCOUNTER — Ambulatory Visit: Payer: Medicare Other

## 2024-11-19 DIAGNOSIS — I5032 Chronic diastolic (congestive) heart failure: Secondary | ICD-10-CM

## 2024-11-20 LAB — CUP PACEART REMOTE DEVICE CHECK
Battery Remaining Longevity: 35 mo
Battery Voltage: 2.92 V
Brady Statistic AP VP Percent: 3.14 %
Brady Statistic AP VS Percent: 0 %
Brady Statistic AS VP Percent: 96.86 %
Brady Statistic AS VS Percent: 0 %
Brady Statistic RA Percent Paced: 3.13 %
Brady Statistic RV Percent Paced: 100 %
Date Time Interrogation Session: 20251230030522
Implantable Lead Connection Status: 753985
Implantable Lead Connection Status: 753985
Implantable Lead Implant Date: 20061024
Implantable Lead Implant Date: 20061024
Implantable Lead Location: 753859
Implantable Lead Location: 753860
Implantable Lead Model: 5076
Implantable Lead Model: 5076
Implantable Pulse Generator Implant Date: 20190104
Lead Channel Impedance Value: 266 Ohm
Lead Channel Impedance Value: 361 Ohm
Lead Channel Impedance Value: 380 Ohm
Lead Channel Impedance Value: 399 Ohm
Lead Channel Pacing Threshold Amplitude: 0.75 V
Lead Channel Pacing Threshold Amplitude: 0.875 V
Lead Channel Pacing Threshold Pulse Width: 0.4 ms
Lead Channel Pacing Threshold Pulse Width: 0.4 ms
Lead Channel Sensing Intrinsic Amplitude: 2 mV
Lead Channel Sensing Intrinsic Amplitude: 2 mV
Lead Channel Setting Pacing Amplitude: 2 V
Lead Channel Setting Pacing Amplitude: 2.5 V
Lead Channel Setting Pacing Pulse Width: 0.4 ms
Lead Channel Setting Sensing Sensitivity: 4 mV
Zone Setting Status: 755011
Zone Setting Status: 755011

## 2024-11-22 ENCOUNTER — Ambulatory Visit: Payer: Self-pay | Admitting: Cardiology

## 2024-11-27 NOTE — Progress Notes (Signed)
 Remote PPM Transmission

## 2024-12-05 ENCOUNTER — Other Ambulatory Visit: Payer: Self-pay | Admitting: Internal Medicine

## 2024-12-06 NOTE — Telephone Encounter (Signed)
 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 Last O/V was with Daphne Barrack NP   In accordance with refill protocols, please review and address the following requirements before this medication refill can be authorized:  Labs

## 2024-12-10 NOTE — Telephone Encounter (Signed)
 Pt had CMP in 10/2024 with stable Cr / potassium.  OK to refill.    Daphne Barrack, NP-C, AGACNP-BC [facility] - Electrophysiology  12/10/2024, 1:31 PM
